# Patient Record
Sex: Male | Born: 1935 | Race: White | Hispanic: No | Smoking: Never smoker
Health system: Southern US, Community
[De-identification: ages and names within clinical notes are randomized; demographics above are authoritative.]

---

## 2019-01-05 ENCOUNTER — Other Ambulatory Visit: Payer: Self-pay

## 2019-01-05 ENCOUNTER — Inpatient Hospital Stay (HOSPITAL_COMMUNITY)
Admission: EM | Admit: 2019-01-05 | Discharge: 2019-01-11 | DRG: 871 | Disposition: A | Discharge status: Skilled Nursing Facility | Payer: Medicare Other | Source: Skilled Nursing Facility | Attending: Internal Medicine | Admitting: Internal Medicine

## 2019-01-05 DIAGNOSIS — L89159 Pressure ulcer of sacral region, unspecified stage: Secondary | ICD-10-CM | POA: Diagnosis present

## 2019-01-05 DIAGNOSIS — S81809A Unspecified open wound, unspecified lower leg, initial encounter: Secondary | ICD-10-CM

## 2019-01-05 DIAGNOSIS — E860 Dehydration: Secondary | ICD-10-CM | POA: Diagnosis present

## 2019-01-05 DIAGNOSIS — A419 Sepsis, unspecified organism: Secondary | ICD-10-CM | POA: Diagnosis present

## 2019-01-05 DIAGNOSIS — I4891 Unspecified atrial fibrillation: Secondary | ICD-10-CM

## 2019-01-05 DIAGNOSIS — E039 Hypothyroidism, unspecified: Secondary | ICD-10-CM | POA: Diagnosis present

## 2019-01-05 DIAGNOSIS — Z79899 Other long term (current) drug therapy: Secondary | ICD-10-CM

## 2019-01-05 DIAGNOSIS — L8990 Pressure ulcer of unspecified site, unspecified stage: Secondary | ICD-10-CM | POA: Insufficient documentation

## 2019-01-05 DIAGNOSIS — Z7401 Bed confinement status: Secondary | ICD-10-CM

## 2019-01-05 DIAGNOSIS — Z66 Do not resuscitate: Secondary | ICD-10-CM | POA: Diagnosis present

## 2019-01-05 DIAGNOSIS — G9341 Metabolic encephalopathy: Secondary | ICD-10-CM | POA: Diagnosis present

## 2019-01-05 DIAGNOSIS — F0390 Unspecified dementia without behavioral disturbance: Secondary | ICD-10-CM | POA: Diagnosis present

## 2019-01-05 DIAGNOSIS — E785 Hyperlipidemia, unspecified: Secondary | ICD-10-CM | POA: Diagnosis present

## 2019-01-05 DIAGNOSIS — Z95810 Presence of automatic (implantable) cardiac defibrillator: Secondary | ICD-10-CM

## 2019-01-05 DIAGNOSIS — R6521 Severe sepsis with septic shock: Secondary | ICD-10-CM | POA: Diagnosis present

## 2019-01-05 DIAGNOSIS — I2510 Atherosclerotic heart disease of native coronary artery without angina pectoris: Secondary | ICD-10-CM | POA: Diagnosis present

## 2019-01-05 DIAGNOSIS — E46 Unspecified protein-calorie malnutrition: Secondary | ICD-10-CM | POA: Diagnosis present

## 2019-01-05 DIAGNOSIS — K219 Gastro-esophageal reflux disease without esophagitis: Secondary | ICD-10-CM | POA: Diagnosis present

## 2019-01-05 DIAGNOSIS — E871 Hypo-osmolality and hyponatremia: Secondary | ICD-10-CM | POA: Diagnosis present

## 2019-01-05 DIAGNOSIS — U071 COVID-19: Secondary | ICD-10-CM | POA: Diagnosis present

## 2019-01-05 DIAGNOSIS — A4189 Other specified sepsis: Principal | ICD-10-CM | POA: Diagnosis present

## 2019-01-05 DIAGNOSIS — Z6830 Body mass index (BMI) 30.0-30.9, adult: Secondary | ICD-10-CM

## 2019-01-05 DIAGNOSIS — J9811 Atelectasis: Secondary | ICD-10-CM | POA: Diagnosis present

## 2019-01-05 DIAGNOSIS — D649 Anemia, unspecified: Secondary | ICD-10-CM | POA: Diagnosis present

## 2019-01-05 DIAGNOSIS — I5032 Chronic diastolic (congestive) heart failure: Secondary | ICD-10-CM | POA: Diagnosis present

## 2019-01-05 DIAGNOSIS — R627 Adult failure to thrive: Secondary | ICD-10-CM | POA: Diagnosis present

## 2019-01-05 DIAGNOSIS — I482 Chronic atrial fibrillation, unspecified: Secondary | ICD-10-CM | POA: Diagnosis present

## 2019-01-05 DIAGNOSIS — Z7982 Long term (current) use of aspirin: Secondary | ICD-10-CM

## 2019-01-05 DIAGNOSIS — Z79890 Hormone replacement therapy (postmenopausal): Secondary | ICD-10-CM

## 2019-01-05 DIAGNOSIS — E872 Acidosis: Secondary | ICD-10-CM | POA: Diagnosis present

## 2019-01-05 DIAGNOSIS — L03114 Cellulitis of left upper limb: Secondary | ICD-10-CM | POA: Diagnosis not present

## 2019-01-05 DIAGNOSIS — Z931 Gastrostomy status: Secondary | ICD-10-CM

## 2019-01-05 DIAGNOSIS — Z86718 Personal history of other venous thrombosis and embolism: Secondary | ICD-10-CM

## 2019-01-05 DIAGNOSIS — R7881 Bacteremia: Secondary | ICD-10-CM

## 2019-01-05 DIAGNOSIS — L0390 Cellulitis, unspecified: Secondary | ICD-10-CM | POA: Diagnosis present

## 2019-01-05 DIAGNOSIS — L03116 Cellulitis of left lower limb: Secondary | ICD-10-CM | POA: Diagnosis present

## 2019-01-05 DIAGNOSIS — E876 Hypokalemia: Secondary | ICD-10-CM | POA: Diagnosis present

## 2019-01-05 LAB — CBC WITH DIFFERENTIAL/PLATELET
Abs Immature Granulocytes: 0.19 10*3/uL — ABNORMAL HIGH (ref 0.00–0.07)
Basophils Absolute: 0 10*3/uL (ref 0.0–0.1)
Basophils Relative: 0 %
Eosinophils Absolute: 0 10*3/uL (ref 0.0–0.5)
Eosinophils Relative: 0 %
HCT: 34.9 % — ABNORMAL LOW (ref 39.0–52.0)
Hemoglobin: 11.1 g/dL — ABNORMAL LOW (ref 13.0–17.0)
Immature Granulocytes: 1 %
Lymphocytes Relative: 6 %
Lymphs Abs: 0.9 10*3/uL (ref 0.7–4.0)
MCH: 30.5 pg (ref 26.0–34.0)
MCHC: 31.8 g/dL (ref 30.0–36.0)
MCV: 95.9 fL (ref 80.0–100.0)
Monocytes Absolute: 0.8 10*3/uL (ref 0.1–1.0)
Monocytes Relative: 5 %
Neutro Abs: 12.7 10*3/uL — ABNORMAL HIGH (ref 1.7–7.7)
Neutrophils Relative %: 88 %
Platelets: 265 10*3/uL (ref 150–400)
RBC: 3.64 MIL/uL — ABNORMAL LOW (ref 4.22–5.81)
RDW: 16.4 % — ABNORMAL HIGH (ref 11.5–15.5)
WBC: 14.5 10*3/uL — ABNORMAL HIGH (ref 4.0–10.5)
nRBC: 0 % (ref 0.0–0.2)

## 2019-01-05 NOTE — ED Triage Notes (Signed)
Pt is coming from [place] with c/o wounds bleeding. Patients caregiver reports minimal bleeding of wounds on bilateral legs. Pedal pulses were found. Pt denies any SOB and wears 2L of oxygen.  VITALS:  BP 101/41 HR 90 RR 18 SPO2 98% 2L

## 2019-01-05 NOTE — ED Notes (Signed)
Attempted to collect two sets of blood cultures unsuccessfully. One set has been collected. Will continue to monitor.

## 2019-01-05 NOTE — ED Provider Notes (Signed)
[facility]-EMERGENCY DEPT Provider Note   CSN: [id number] Arrival date & time: 01/05/19  2130     History   Chief Complaint Chief Complaint  Patient presents with  . Covid +  . Wounds    HPI Logan Blackwell is a 85 y.o. male with a h/o of Atrial Fibrillation, cognitive communication deficit, hypothyroidism, hyperlipidemia, hyponatremia, CAD, chronic atrial fibrillation, chronic diastolic heart failure, and history of VTE, and pacemaker obtained from the patient's admission record for [facility].  Spoke with Inge Rise, LPN at [facility].  Reports that the patient tested positive for COVID-19 yesterday.  He was at a facility called [facility] in [place], but was transferred to [facility] and [place] because they had a Covid unit.  He arrived on the floor tonight at approximately 18:30.  Nursing staff was completing an initial assessment and became concerned after reviewing 2 large wounds on his lower legs so he was sent to the emergency department for further evaluation.  Per [facility] admission review, the patient has dementia and medical conditions listed above. He is currently on doxycycline for left elbow cellulitis on 11/22.   Patient is oriented to his name and year.  He believes that he is in [facility].  He was unaware that he had been transferred to [facility].  Per the patient, he thinks that he tested positive for COVID-19 a month ago.  He believes himself to be at the ER because his nurse wrapped the wounds on his legs too tightly.  He states " they keep saying that I fell and that is what caused these wounds, but it is just because of how tightly the dressing was placed."  He is a full code.  No family at bedside.  Addendum: 02:58-department Diplomatic Services operational officer was able to obtain records from [facility]. PEG tube in secondary to dysphagia.  He had a previous hospitalization from 7/25-8/10 after he presented for a fall.  He was found to  have septic shock secondary to UTI.  His CODE STATUS was noted to be attempt CPR during this admission.  He was noted to have had metabolic encephalopathy.  He has an AICD but is not on anticoagulation after what sounds like hemarthrosis of the knee in 2019.  Past medical history includes hypertension, dyslipidemia, A. fib, history of DVT, coagulopathy, hypothyroidism, thrombocytopenia, hypoalbuminemia, hypokalemia, CAD, anemia, rhabdomyolysis, septic shock, cellulitis, CHF, and AKI.  Level V caveat secondary to dementia.     The history is provided by the patient and the nursing home. The history is limited by the condition of the patient and the absence of a caregiver. No language interpreter was used.    No past medical history on file.  There are no active problems to display for this patient.    Home Medications    Prior to Admission medications   Medication Sig Start Date End Date Taking? Authorizing Provider  acetaminophen (TYLENOL) 325 MG tablet Take 650 mg by mouth every 8 (eight) hours as needed for mild pain, moderate pain, fever or headache.   Yes [provider]  Amino Acids-Protein Hydrolys (FEEDING SUPPLEMENT, PRO-STAT SUGAR FREE 64,) LIQD Take 30 mLs by mouth 3 (three) times daily with meals.   Yes [provider]  aspirin 81 MG chewable tablet Chew 81 mg by mouth daily.   Yes [provider]  atorvastatin (LIPITOR) 40 MG tablet Take 40 mg by mouth at bedtime.   Yes [provider]  emollient (BIAFINE)  cream Apply 1 application topically daily.   Yes [provider]  Ferrous Sulfate 220 (44 Fe) MG/5ML LIQD Take 5 mLs by mouth daily.   Yes [provider]  folic acid (FOLVITE) 1 MG tablet Take 1 mg by mouth daily.   Yes [provider]  furosemide (LASIX) 40 MG tablet Take 40 mg by mouth daily.   Yes [provider]  ketoconazole (NIZORAL) 2 % cream Apply 1 application topically 2 (two) times daily.    Yes [provider]  levothyroxine (SYNTHROID) 175 MCG tablet Take 175 mcg by mouth daily before breakfast.   Yes [provider]  Melatonin 3 MG CAPS Take 3 mg by mouth at bedtime.   Yes [provider]  metoprolol tartrate (LOPRESSOR) 25 MG tablet Take 25 mg by mouth 2 (two) times daily.   Yes [provider]  Multiple Vitamin (MULTIVITAMIN) LIQD Take 5 mLs by mouth daily.   Yes [provider]  pantoprazole sodium (PROTONIX) 40 mg/20 mL PACK Take 40 mg by mouth daily.   Yes [provider]  potassium chloride 20 MEQ/15ML (10%) SOLN Take 20 mEq by mouth daily.   Yes [provider]  sertraline (ZOLOFT) 50 MG tablet Take 50 mg by mouth daily.   Yes [provider]  vitamin B-12 (CYANOCOBALAMIN) 1000 MCG tablet Take 1,000 mcg by mouth daily.   Yes [provider]    Family History No family history on file.  Social History Social History   Tobacco Use  . Smoking status: Not on file  Substance Use Topics  . Alcohol use: Not on file  . Drug use: Not on file     Allergies   Patient has no known allergies.   Review of Systems Review of Systems  Unable to perform ROS: Dementia     Physical Exam Updated Vital Signs BP (!) 101/56 (BP Location: Right Arm)   Pulse 82   Temp 98.9 F (37.2 C) (Rectal)   Resp (!) 22   SpO2 98%   Physical Exam Vitals signs and nursing note reviewed.  Constitutional:      Appearance: He is well-developed.     Comments: Nasal cannula in place on 2.5 L  HENT:     Head: Normocephalic.  Eyes:     Conjunctiva/sclera: Conjunctivae normal.  Neck:     Musculoskeletal: Neck supple.  Cardiovascular:     Rate and Rhythm: Normal rate and regular rhythm.     Pulses: Normal pulses.     Heart sounds: Normal heart sounds. No murmur. No friction rub. No gallop.   Pulmonary:     Effort: Pulmonary effort is normal.     Comments: No retractions, accessory muscle use or  increased work of breathing. Abdominal:     General: There is no distension.     Palpations: Abdomen is soft. There is no mass.     Tenderness: There is no abdominal tenderness. There is no right CVA tenderness, left CVA tenderness, guarding or rebound.     Hernia: No hernia is present.     Comments: PEG tube in place.  No tenderness, redness, or swelling around the site.  Musculoskeletal:     Comments: Most of the entire left upper extremity is red and hot to the touch.  There is swelling noted to the forearm.  He has pain with range of motion of the left elbow.  He is also tender to palpation diffusely to the left forearm and upper  arm.  Radial pulses are 2+ bilaterally.  He is able to move his digits of the bilateral hands.  No significant tenderness to palpation to the left shoulder.  He has a skin tear noted to the left upper back.  There is a sacral decubitus ulcer, unable to be staged at this time.  Skin:    General: Skin is warm and dry.  Neurological:     Mental Status: He is alert.  Psychiatric:        Behavior: Behavior normal.   Left lower extremity-dressing dated 11/24 was in place.  There appeared to be stool on the outside of the dressing.  There was an ABD pad under gauze that was saturated with blood.  There is exposed muscle.  No obvious bone exposure.       Right lower extremity-chronic appearing venous stasis ulcer noted to the right lower leg.   DP pulses are palpable with Doppler.  ED Treatments / Results  Labs (all labs ordered are listed, but only abnormal results are displayed) Labs Reviewed  LACTIC ACID, PLASMA - Abnormal; Notable for the following components:      Result Value   Lactic Acid, Venous 2.3 (*)    All other components within normal limits  LACTIC ACID, PLASMA - Abnormal; Notable for the following components:   Lactic Acid, Venous 2.0 (*)    All other components within normal limits  COMPREHENSIVE METABOLIC PANEL - Abnormal; Notable for  the following components:   Sodium 134 (*)    Glucose, Bld 109 (*)    BUN 46 (*)    Calcium 7.9 (*)    Total Protein 6.2 (*)    Albumin 2.3 (*)    AST 43 (*)    All other components within normal limits  CBC WITH DIFFERENTIAL/PLATELET - Abnormal; Notable for the following components:   WBC 14.5 (*)    RBC 3.64 (*)    Hemoglobin 11.1 (*)    HCT 34.9 (*)    RDW 16.4 (*)    Neutro Abs 12.7 (*)    Abs Immature Granulocytes 0.19 (*)    All other components within normal limits  URINALYSIS, ROUTINE W REFLEX MICROSCOPIC - Abnormal; Notable for the following components:   APPearance HAZY (*)    Hgb urine dipstick MODERATE (*)    Bacteria, UA RARE (*)    All other components within normal limits  CULTURE, BLOOD (ROUTINE X 2)  CULTURE, BLOOD (ROUTINE X 2)  URINE CULTURE  APTT  PROTIME-INR  POC SARS CORONAVIRUS 2 AG -  ED    EKG None  Radiology Dg Tibia/fibula Left  Result Date: 01/06/2019 CLINICAL DATA:  Bleeding wounds EXAM: LEFT TIBIA AND FIBULA - 2 VIEW COMPARISON:  None. FINDINGS: Diffuse vascular calcifications. No acute bony abnormality. Specifically, no fracture, subluxation, or dislocation. IMPRESSION: No acute bony abnormality. Electronically Signed   By: Rolm Baptise M.D.   On: 01/06/2019 01:17   Dg Tibia/fibula Right  Result Date: 01/06/2019 CLINICAL DATA:  Bleeding wounds EXAM: RIGHT TIBIA AND FIBULA - 2 VIEW COMPARISON:  None. FINDINGS: Diffuse vascular calcifications. No acute bony abnormality. Specifically, no fracture, subluxation, or dislocation. IMPRESSION: No acute bony abnormality. Electronically Signed   By: Rolm Baptise M.D.   On: 01/06/2019 01:16   Dg Chest Port 1 View  Result Date: 01/06/2019 CLINICAL DATA:  COVID positive EXAM: PORTABLE CHEST 1 VIEW COMPARISON:  None. FINDINGS: Left pacer in place with leads in the right atrium and right ventricle. Aortic  calcifications. Low lung volumes. Right infrahilar atelectasis or infiltrate. Left base atelectasis  and possible small left effusion versus pleural thickening. Left hilar or infrahilar calcification compatible with old granulomatous disease. IMPRESSION: Bibasilar atelectasis or infiltrates. Possible small left effusion versus pleural thickening. Electronically Signed   By: Charlett Nose M.D.   On: 01/06/2019 01:18    Procedures .Critical Care Performed by: Barkley Boards, PA-C Authorized by: Barkley Boards, PA-C   Critical care provider statement:    Critical care time (minutes):  55   Critical care time was exclusive of:  Separately billable procedures and treating other patients and teaching time   Critical care was necessary to treat or prevent imminent or life-threatening deterioration of the following conditions:  Sepsis   Critical care was time spent personally by me on the following activities:  Ordering and performing treatments and interventions, ordering and review of laboratory studies, ordering and review of radiographic studies, pulse oximetry, re-evaluation of patient's condition, review of old charts, obtaining history from patient or surrogate, examination of patient, evaluation of patient's response to treatment and development of treatment plan with patient or surrogate   (including critical care time)  Medications Ordered in ED Medications  vancomycin (VANCOCIN) 1,250 mg in sodium chloride 0.9 % 250 mL IVPB (1,250 mg Intravenous New Bag/Given 01/06/19 0157)  sodium chloride 0.9 % bolus 500 mL (0 mLs Intravenous Stopped 01/06/19 0152)  ceFEPIme (MAXIPIME) 2 g in sodium chloride 0.9 % 100 mL IVPB (0 g Intravenous Stopped 01/06/19 0152)  fentaNYL (SUBLIMAZE) injection 50 mcg (50 mcg Intravenous Given 01/06/19 0307)     Initial Impression / Assessment and Plan / ED Course  I have reviewed the triage vital signs and the nursing notes.  Pertinent labs & imaging results that were available during my care of the patient were reviewed by me and considered in my medical  decision making (see chart for details).        85 year old male with a h/o of Atrial Fibrillation, cognitive communication deficit, hypothyroidism, hyperlipidemia, hyponatremia, CAD, chronic atrial fibrillation, chronic diastolic heart failure, and history of VTE, and pacemaker presenting by EMS from [facility] facility.   The patient appears to have a complex medical history, but the patient although he is oriented to name and year, seems confused.  Friend says, he told me that he believes that he tested positive for COVID-19 over a month ago; however, staff reports that his positive Covid test was yesterday.  Attempting to obtain medical records from [facility] and [facility] at this time.  Patient is not hypoxic and not on 2.5 L nasal cannula.  Blood pressures have been soft, but he has not been hypotensive.  Rectal temp is 98.9.  However, lactate is elevated and improving on repeat with 500 cc fluid bolus.  Fluids given cautiously given his history of heart failure and known COVID-19 diagnosis.  He also has a leukocytosis of 14.  Code sepsis initiated and he was started on broad-spectrum antibiotics with cefepime and vancomycin.  Source is thought to be the wounds on his lower legs as well as his left upper extremity.  No evidence of osteomyelitis on bilateral lower extremity x-rays.  he does have a history of DVT and is not on anticoagulation.  If CT is unremarkable, he may need a venous duplex of his left upper extremity.  Chest x-ray with bibasilar atelectasis or infiltrates, concerning for small left effusion versus pleural thickening.  Suspect this is secondary  to COVID-19.  Per admission record at [facility], the patient has been treated for cellulitis of his left elbow for the last week.  On exam, his entire left upper extremity is red and hot to the touch.  CT of the left upper extremity has been ordered and is pending.  Consult to the hospitalist team and Dr.  Toniann Fail will admit. The patient appears reasonably stabilized for admission considering the current resources, flow, and capabilities available in the ED at this time, and I doubt any other [facility] requiring further screening and/or treatment in the ED prior to admission.   Final Clinical Impressions(s) / ED Diagnoses   Final diagnoses:  Open leg wound  Sepsis without acute organ dysfunction, due to unspecified organism [facility])  Cellulitis of left arm    ED Discharge Orders    None       Barkley Boards, PA-C 01/06/19 0322    Palumbo, April, MD 01/06/19 [id number]

## 2019-01-06 ENCOUNTER — Encounter (HOSPITAL_COMMUNITY): Payer: Medicare Other

## 2019-01-06 ENCOUNTER — Emergency Department (HOSPITAL_COMMUNITY): Payer: Medicare Other

## 2019-01-06 ENCOUNTER — Encounter (HOSPITAL_COMMUNITY): Payer: Self-pay

## 2019-01-06 ENCOUNTER — Inpatient Hospital Stay (HOSPITAL_COMMUNITY): Payer: Medicare Other

## 2019-01-06 DIAGNOSIS — A4189 Other specified sepsis: Secondary | ICD-10-CM | POA: Diagnosis present

## 2019-01-06 DIAGNOSIS — R7881 Bacteremia: Secondary | ICD-10-CM | POA: Diagnosis not present

## 2019-01-06 DIAGNOSIS — S81809A Unspecified open wound, unspecified lower leg, initial encounter: Secondary | ICD-10-CM | POA: Insufficient documentation

## 2019-01-06 DIAGNOSIS — L989 Disorder of the skin and subcutaneous tissue, unspecified: Secondary | ICD-10-CM | POA: Diagnosis not present

## 2019-01-06 DIAGNOSIS — I340 Nonrheumatic mitral (valve) insufficiency: Secondary | ICD-10-CM | POA: Diagnosis not present

## 2019-01-06 DIAGNOSIS — E871 Hypo-osmolality and hyponatremia: Secondary | ICD-10-CM | POA: Diagnosis present

## 2019-01-06 DIAGNOSIS — E46 Unspecified protein-calorie malnutrition: Secondary | ICD-10-CM | POA: Diagnosis present

## 2019-01-06 DIAGNOSIS — I2510 Atherosclerotic heart disease of native coronary artery without angina pectoris: Secondary | ICD-10-CM | POA: Diagnosis present

## 2019-01-06 DIAGNOSIS — Z95810 Presence of automatic (implantable) cardiac defibrillator: Secondary | ICD-10-CM | POA: Diagnosis not present

## 2019-01-06 DIAGNOSIS — D649 Anemia, unspecified: Secondary | ICD-10-CM | POA: Diagnosis present

## 2019-01-06 DIAGNOSIS — L89159 Pressure ulcer of sacral region, unspecified stage: Secondary | ICD-10-CM | POA: Diagnosis present

## 2019-01-06 DIAGNOSIS — Z86718 Personal history of other venous thrombosis and embolism: Secondary | ICD-10-CM | POA: Diagnosis not present

## 2019-01-06 DIAGNOSIS — L03114 Cellulitis of left upper limb: Secondary | ICD-10-CM | POA: Diagnosis present

## 2019-01-06 DIAGNOSIS — Z79890 Hormone replacement therapy (postmenopausal): Secondary | ICD-10-CM | POA: Diagnosis not present

## 2019-01-06 DIAGNOSIS — L0390 Cellulitis, unspecified: Secondary | ICD-10-CM | POA: Diagnosis present

## 2019-01-06 DIAGNOSIS — Z7982 Long term (current) use of aspirin: Secondary | ICD-10-CM | POA: Diagnosis not present

## 2019-01-06 DIAGNOSIS — L03116 Cellulitis of left lower limb: Secondary | ICD-10-CM | POA: Diagnosis present

## 2019-01-06 DIAGNOSIS — A419 Sepsis, unspecified organism: Secondary | ICD-10-CM | POA: Diagnosis not present

## 2019-01-06 DIAGNOSIS — U071 COVID-19: Secondary | ICD-10-CM | POA: Diagnosis present

## 2019-01-06 DIAGNOSIS — G9341 Metabolic encephalopathy: Secondary | ICD-10-CM | POA: Diagnosis present

## 2019-01-06 DIAGNOSIS — Z931 Gastrostomy status: Secondary | ICD-10-CM | POA: Diagnosis not present

## 2019-01-06 DIAGNOSIS — I361 Nonrheumatic tricuspid (valve) insufficiency: Secondary | ICD-10-CM | POA: Diagnosis not present

## 2019-01-06 DIAGNOSIS — I5032 Chronic diastolic (congestive) heart failure: Secondary | ICD-10-CM | POA: Diagnosis present

## 2019-01-06 DIAGNOSIS — B9689 Other specified bacterial agents as the cause of diseases classified elsewhere: Secondary | ICD-10-CM | POA: Diagnosis not present

## 2019-01-06 DIAGNOSIS — R6521 Severe sepsis with septic shock: Secondary | ICD-10-CM | POA: Diagnosis present

## 2019-01-06 DIAGNOSIS — L309 Dermatitis, unspecified: Secondary | ICD-10-CM | POA: Diagnosis not present

## 2019-01-06 DIAGNOSIS — I482 Chronic atrial fibrillation, unspecified: Secondary | ICD-10-CM | POA: Diagnosis present

## 2019-01-06 DIAGNOSIS — E039 Hypothyroidism, unspecified: Secondary | ICD-10-CM | POA: Diagnosis present

## 2019-01-06 DIAGNOSIS — J9811 Atelectasis: Secondary | ICD-10-CM | POA: Diagnosis present

## 2019-01-06 DIAGNOSIS — I4891 Unspecified atrial fibrillation: Secondary | ICD-10-CM | POA: Diagnosis not present

## 2019-01-06 DIAGNOSIS — F0390 Unspecified dementia without behavioral disturbance: Secondary | ICD-10-CM | POA: Diagnosis present

## 2019-01-06 DIAGNOSIS — Z79899 Other long term (current) drug therapy: Secondary | ICD-10-CM | POA: Diagnosis not present

## 2019-01-06 DIAGNOSIS — R609 Edema, unspecified: Secondary | ICD-10-CM | POA: Diagnosis not present

## 2019-01-06 DIAGNOSIS — Z66 Do not resuscitate: Secondary | ICD-10-CM | POA: Diagnosis present

## 2019-01-06 DIAGNOSIS — E785 Hyperlipidemia, unspecified: Secondary | ICD-10-CM | POA: Diagnosis present

## 2019-01-06 LAB — CBC WITH DIFFERENTIAL/PLATELET
Abs Immature Granulocytes: 0.16 10*3/uL — ABNORMAL HIGH (ref 0.00–0.07)
Basophils Absolute: 0 10*3/uL (ref 0.0–0.1)
Basophils Relative: 0 %
Eosinophils Absolute: 0 10*3/uL (ref 0.0–0.5)
Eosinophils Relative: 0 %
HCT: 32.4 % — ABNORMAL LOW (ref 39.0–52.0)
Hemoglobin: 10.4 g/dL — ABNORMAL LOW (ref 13.0–17.0)
Immature Granulocytes: 1 %
Lymphocytes Relative: 7 %
Lymphs Abs: 1 10*3/uL (ref 0.7–4.0)
MCH: 30.8 pg (ref 26.0–34.0)
MCHC: 32.1 g/dL (ref 30.0–36.0)
MCV: 95.9 fL (ref 80.0–100.0)
Monocytes Absolute: 0.7 10*3/uL (ref 0.1–1.0)
Monocytes Relative: 5 %
Neutro Abs: 11.3 10*3/uL — ABNORMAL HIGH (ref 1.7–7.7)
Neutrophils Relative %: 87 %
Platelets: 227 10*3/uL (ref 150–400)
RBC: 3.38 MIL/uL — ABNORMAL LOW (ref 4.22–5.81)
RDW: 16.2 % — ABNORMAL HIGH (ref 11.5–15.5)
WBC: 13.1 10*3/uL — ABNORMAL HIGH (ref 4.0–10.5)
nRBC: 0 % (ref 0.0–0.2)

## 2019-01-06 LAB — URINALYSIS, ROUTINE W REFLEX MICROSCOPIC
Bilirubin Urine: NEGATIVE
Glucose, UA: NEGATIVE mg/dL
Ketones, ur: NEGATIVE mg/dL
Leukocytes,Ua: NEGATIVE
Nitrite: NEGATIVE
Protein, ur: NEGATIVE mg/dL
Specific Gravity, Urine: 1.015 (ref 1.005–1.030)
pH: 5 (ref 5.0–8.0)

## 2019-01-06 LAB — COMPREHENSIVE METABOLIC PANEL
ALT: 23 U/L (ref 0–44)
ALT: 24 U/L (ref 0–44)
AST: 42 U/L — ABNORMAL HIGH (ref 15–41)
AST: 43 U/L — ABNORMAL HIGH (ref 15–41)
Albumin: 2.1 g/dL — ABNORMAL LOW (ref 3.5–5.0)
Albumin: 2.3 g/dL — ABNORMAL LOW (ref 3.5–5.0)
Alkaline Phosphatase: 45 U/L (ref 38–126)
Alkaline Phosphatase: 49 U/L (ref 38–126)
Anion gap: 12 (ref 5–15)
Anion gap: 9 (ref 5–15)
BUN: 46 mg/dL — ABNORMAL HIGH (ref 8–23)
BUN: 47 mg/dL — ABNORMAL HIGH (ref 8–23)
CO2: 24 mmol/L (ref 22–32)
CO2: 25 mmol/L (ref 22–32)
Calcium: 7.9 mg/dL — ABNORMAL LOW (ref 8.9–10.3)
Calcium: 7.9 mg/dL — ABNORMAL LOW (ref 8.9–10.3)
Chloride: 100 mmol/L (ref 98–111)
Chloride: 98 mmol/L (ref 98–111)
Creatinine, Ser: 0.87 mg/dL (ref 0.61–1.24)
Creatinine, Ser: 0.95 mg/dL (ref 0.61–1.24)
GFR calc Af Amer: >60 mL/min (ref >60)
GFR calc Af Amer: >60 mL/min (ref >60)
GFR calc non Af Amer: >60 mL/min (ref >60)
GFR calc non Af Amer: >60 mL/min (ref >60)
Glucose, Bld: 105 mg/dL — ABNORMAL HIGH (ref 70–99)
Glucose, Bld: 109 mg/dL — ABNORMAL HIGH (ref 70–99)
Potassium: 3.6 mmol/L (ref 3.5–5.1)
Potassium: 3.9 mmol/L (ref 3.5–5.1)
Sodium: 134 mmol/L — ABNORMAL LOW (ref 135–145)
Sodium: 134 mmol/L — ABNORMAL LOW (ref 135–145)
Total Bilirubin: 0.8 mg/dL (ref 0.3–1.2)
Total Bilirubin: 1 mg/dL (ref 0.3–1.2)
Total Protein: 5.8 g/dL — ABNORMAL LOW (ref 6.5–8.1)
Total Protein: 6.2 g/dL — ABNORMAL LOW (ref 6.5–8.1)

## 2019-01-06 LAB — BLOOD CULTURE ID PANEL (REFLEXED)
Acinetobacter baumannii: DETECTED — AB
Candida albicans: NOT DETECTED
Candida glabrata: NOT DETECTED
Candida krusei: NOT DETECTED
Candida parapsilosis: NOT DETECTED
Candida tropicalis: NOT DETECTED
Carbapenem resistance: NOT DETECTED
Enterobacter cloacae complex: NOT DETECTED
Enterobacteriaceae species: NOT DETECTED
Enterococcus species: NOT DETECTED
Escherichia coli: NOT DETECTED
Haemophilus influenzae: NOT DETECTED
Klebsiella oxytoca: NOT DETECTED
Klebsiella pneumoniae: NOT DETECTED
Listeria monocytogenes: NOT DETECTED
Neisseria meningitidis: NOT DETECTED
Proteus species: NOT DETECTED
Pseudomonas aeruginosa: NOT DETECTED
Serratia marcescens: NOT DETECTED
Staphylococcus aureus (BCID): NOT DETECTED
Staphylococcus species: NOT DETECTED
Streptococcus agalactiae: NOT DETECTED
Streptococcus pneumoniae: NOT DETECTED
Streptococcus pyogenes: NOT DETECTED
Streptococcus species: NOT DETECTED

## 2019-01-06 LAB — PROTIME-INR
INR: 1.1 (ref 0.8–1.2)
Prothrombin Time: 14.5 seconds (ref 11.4–15.2)

## 2019-01-06 LAB — GLUCOSE, CAPILLARY
Glucose-Capillary: 139 mg/dL — ABNORMAL HIGH (ref 70–99)
Glucose-Capillary: 137 mg/dL — ABNORMAL HIGH (ref 70–99)

## 2019-01-06 LAB — LACTIC ACID, PLASMA
Lactic Acid, Venous: 1.6 mmol/L (ref 0.5–1.9)
Lactic Acid, Venous: 1.8 mmol/L (ref 0.5–1.9)
Lactic Acid, Venous: 2 mmol/L (ref 0.5–1.9)
Lactic Acid, Venous: 2.3 mmol/L (ref 0.5–1.9)

## 2019-01-06 LAB — URINE CULTURE: Culture: NO GROWTH

## 2019-01-06 LAB — ECHOCARDIOGRAM COMPLETE
Height: 67 in
Weight: 3072.33 oz

## 2019-01-06 LAB — POC SARS CORONAVIRUS 2 AG -  ED: SARS Coronavirus 2 Ag: POSITIVE — AB

## 2019-01-06 LAB — MRSA PCR SCREENING: MRSA by PCR: NEGATIVE

## 2019-01-06 LAB — APTT: aPTT: 34 seconds (ref 24–36)

## 2019-01-06 LAB — PROCALCITONIN: Procalcitonin: 3.27 ng/mL

## 2019-01-06 MED ORDER — FREE WATER
100.0000 mL | Freq: Four times a day (QID) | Status: DC
  Administered 2019-01-06 – 2019-01-11 (×18): 100 mL

## 2019-01-06 MED ORDER — SODIUM CHLORIDE 0.9 % IV SOLN
2.0000 g | Freq: Three times a day (TID) | INTRAVENOUS | Status: DC
  Administered 2019-01-06 (×2): 2 g via INTRAVENOUS
  Filled 2019-01-06 (×2): qty 2

## 2019-01-06 MED ORDER — VANCOMYCIN HCL 10 G IV SOLR
1250.0000 mg | Freq: Once | INTRAVENOUS | Status: AC
  Administered 2019-01-06: 1250 mg via INTRAVENOUS
  Filled 2019-01-06: qty 1250

## 2019-01-06 MED ORDER — SODIUM CHLORIDE 0.9 % IV SOLN
INTRAVENOUS | Status: AC
  Administered 2019-01-06 (×3): via INTRAVENOUS

## 2019-01-06 MED ORDER — METOPROLOL TARTRATE 25 MG PO TABS
25.0000 mg | ORAL_TABLET | Freq: Two times a day (BID) | ORAL | Status: DC
  Administered 2019-01-07 – 2019-01-11 (×8): 25 mg via ORAL
  Filled 2019-01-06 (×8): qty 1

## 2019-01-06 MED ORDER — VITAL HIGH PROTEIN PO LIQD: 1000.0000 mL | ORAL | Status: DC

## 2019-01-06 MED ORDER — SERTRALINE HCL 50 MG PO TABS
50.0000 mg | ORAL_TABLET | Freq: Every day | ORAL | Status: DC
  Administered 2019-01-07 – 2019-01-11 (×5): 50 mg via ORAL
  Filled 2019-01-06 (×5): qty 1

## 2019-01-06 MED ORDER — LEVOTHYROXINE SODIUM 50 MCG PO TABS
175.0000 ug | ORAL_TABLET | Freq: Every day | ORAL | Status: DC
  Administered 2019-01-08 – 2019-01-11 (×4): 175 ug via ORAL
  Filled 2019-01-06 (×9): qty 1

## 2019-01-06 MED ORDER — IOHEXOL 300 MG/ML  SOLN
100.0000 mL | Freq: Once | INTRAMUSCULAR | Status: AC | PRN
  Administered 2019-01-06: 100 mL via INTRAVENOUS

## 2019-01-06 MED ORDER — ACETAMINOPHEN 325 MG PO TABS
650.0000 mg | ORAL_TABLET | Freq: Four times a day (QID) | ORAL | Status: DC | PRN
  Administered 2019-01-07: 650 mg via ORAL
  Filled 2019-01-06: qty 2

## 2019-01-06 MED ORDER — OSMOLITE 1.5 CAL PO LIQD
1000.0000 mL | ORAL | Status: DC
  Administered 2019-01-06 – 2019-01-10 (×4): 1000 mL
  Filled 2019-01-06 (×6): qty 1000

## 2019-01-06 MED ORDER — GERHARDT'S BUTT CREAM
TOPICAL_CREAM | Freq: Three times a day (TID) | CUTANEOUS | Status: AC
  Administered 2019-01-06 – 2019-01-09 (×9): via TOPICAL
  Administered 2019-01-09: 1 via TOPICAL
  Administered 2019-01-09 – 2019-01-10 (×4): via TOPICAL
  Filled 2019-01-06: qty 1

## 2019-01-06 MED ORDER — HEPARIN SODIUM (PORCINE) 5000 UNIT/ML IJ SOLN
5000.0000 [IU] | Freq: Three times a day (TID) | INTRAMUSCULAR | Status: DC
  Administered 2019-01-06 – 2019-01-11 (×15): 5000 [IU] via SUBCUTANEOUS
  Filled 2019-01-06 (×15): qty 1

## 2019-01-06 MED ORDER — VANCOMYCIN HCL 10 G IV SOLR
1500.0000 mg | INTRAVENOUS | Status: DC
  Administered 2019-01-06 – 2019-01-07 (×2): 1500 mg via INTRAVENOUS
  Filled 2019-01-06 (×3): qty 1500

## 2019-01-06 MED ORDER — VITAMIN B-12 1000 MCG PO TABS
1000.0000 ug | ORAL_TABLET | Freq: Every day | ORAL | Status: DC
  Administered 2019-01-07 – 2019-01-11 (×5): 1000 ug via ORAL
  Filled 2019-01-06 (×5): qty 1

## 2019-01-06 MED ORDER — FERROUS SULFATE 300 (60 FE) MG/5ML PO SYRP
300.0000 mg | ORAL_SOLUTION | Freq: Every day | ORAL | Status: DC
  Administered 2019-01-07 – 2019-01-11 (×5): 300 mg via ORAL
  Filled 2019-01-06 (×6): qty 5

## 2019-01-06 MED ORDER — ADULT MULTIVITAMIN LIQUID CH
15.0000 mL | Freq: Every day | ORAL | Status: DC
  Administered 2019-01-06 – 2019-01-11 (×6): 15 mL via ORAL
  Filled 2019-01-06 (×6): qty 15

## 2019-01-06 MED ORDER — PANTOPRAZOLE SODIUM 40 MG PO PACK
40.0000 mg | PACK | Freq: Every day | ORAL | Status: DC
  Administered 2019-01-07 – 2019-01-11 (×5): 40 mg via ORAL
  Filled 2019-01-06 (×5): qty 20

## 2019-01-06 MED ORDER — FENTANYL CITRATE (PF) 100 MCG/2ML IJ SOLN
50.0000 ug | Freq: Once | INTRAMUSCULAR | Status: AC
  Administered 2019-01-06: 03:00:00 50 ug via INTRAVENOUS
  Filled 2019-01-06: qty 2

## 2019-01-06 MED ORDER — PRO-STAT SUGAR FREE PO LIQD: 30.0000 mL | Freq: Three times a day (TID) | ORAL | Status: DC

## 2019-01-06 MED ORDER — SODIUM CHLORIDE 0.9 % IV BOLUS
500.0000 mL | Freq: Once | INTRAVENOUS | Status: AC
  Administered 2019-01-06: 500 mL via INTRAVENOUS

## 2019-01-06 MED ORDER — ACETAMINOPHEN 650 MG RE SUPP: 650.0000 mg | Freq: Four times a day (QID) | RECTAL | Status: DC | PRN

## 2019-01-06 MED ORDER — CHLORHEXIDINE GLUCONATE CLOTH 2 % EX PADS
6.0000 | MEDICATED_PAD | Freq: Every day | CUTANEOUS | Status: DC
  Administered 2019-01-06 – 2019-01-11 (×6): 6 via TOPICAL

## 2019-01-06 MED ORDER — SODIUM CHLORIDE 0.9 % IV SOLN
500.0000 mg | Freq: Four times a day (QID) | INTRAVENOUS | Status: DC
  Administered 2019-01-06 – 2019-01-08 (×8): 500 mg via INTRAVENOUS
  Filled 2019-01-06 (×9): qty 500

## 2019-01-06 MED ORDER — PRO-STAT SUGAR FREE PO LIQD
30.0000 mL | Freq: Three times a day (TID) | ORAL | Status: DC
  Administered 2019-01-06 – 2019-01-10 (×12): 30 mL
  Filled 2019-01-06 (×12): qty 30

## 2019-01-06 MED ORDER — ORAL CARE MOUTH RINSE
15.0000 mL | Freq: Two times a day (BID) | OROMUCOSAL | Status: DC
  Administered 2019-01-06 (×2): 15 mL via OROMUCOSAL

## 2019-01-06 MED ORDER — ONDANSETRON HCL 4 MG/2ML IJ SOLN: 4.0000 mg | Freq: Four times a day (QID) | INTRAMUSCULAR | Status: DC | PRN

## 2019-01-06 MED ORDER — ASPIRIN 81 MG PO CHEW
81.0000 mg | CHEWABLE_TABLET | Freq: Every day | ORAL | Status: DC
  Administered 2019-01-07 – 2019-01-11 (×5): 81 mg via ORAL
  Filled 2019-01-06 (×5): qty 1

## 2019-01-06 MED ORDER — SODIUM CHLORIDE (PF) 0.9 % IJ SOLN
INTRAMUSCULAR | Status: AC
  Filled 2019-01-06: qty 50

## 2019-01-06 MED ORDER — FOLIC ACID 1 MG PO TABS
1.0000 mg | ORAL_TABLET | Freq: Every day | ORAL | Status: DC
  Administered 2019-01-07 – 2019-01-11 (×5): 1 mg via ORAL
  Filled 2019-01-06 (×5): qty 1

## 2019-01-06 MED ORDER — ADULT MULTIVITAMIN LIQUID CH: 5.0000 mL | Freq: Every day | ORAL | Status: DC

## 2019-01-06 MED ORDER — ONDANSETRON HCL 4 MG PO TABS: 4.0000 mg | ORAL_TABLET | Freq: Four times a day (QID) | ORAL | Status: DC | PRN

## 2019-01-06 MED ORDER — ATORVASTATIN CALCIUM 40 MG PO TABS
40.0000 mg | ORAL_TABLET | Freq: Every day | ORAL | Status: DC
  Administered 2019-01-06 – 2019-01-10 (×5): 40 mg via ORAL
  Filled 2019-01-06 (×5): qty 1

## 2019-01-06 MED ORDER — SODIUM CHLORIDE 0.9 % IV SOLN
2.0000 g | INTRAVENOUS | Status: AC
  Administered 2019-01-06: 2 g via INTRAVENOUS
  Filled 2019-01-06: qty 2

## 2019-01-06 NOTE — Progress Notes (Signed)
Initial Nutrition Assessment  RD working remotely.   DOCUMENTATION CODES:   Not applicable  INTERVENTION:  - will order TF to meet 100% estimated needs as orally intake is unknown. - will order Osmolite 1.5 @ 50 ml/hr with 30 ml prostat TID.  - this regimen will provide 2100 kcal (96% estimated kcal need), 120 grams protein, and 914 ml free water. - free water flush, if desired, to be per MD.    NUTRITION DIAGNOSIS:   Increased nutrient needs related to wound healing, acute illness as evidenced by estimated needs.  GOAL:   Patient will meet greater than or equal to 90% of their needs  MONITOR:   PO intake, TF tolerance, Labs, Weight trends, Skin  REASON FOR ASSESSMENT:   Consult Enteral/tube feeding initiation and management, Assessment of nutrition requirement/status  ASSESSMENT:   85 y.o. male with history of AICD placement, A. fib, DVT, hypothyroidism, hypoalbuminemia, and CAD. He was admitted in July for septic shock. He was found to be COVID-19 positive PTA. He resides at [facility] and was found to have blood-tinged discharge from BLE wounds. He is unable to provide any history. He has a PEG.  Flow sheet indicates patient is a/o to person and place. Patient is COVID-19 positive. Able to talk with RN. Patient is currently on Heart Healthy diet. RN reports that patient has very poor dentition--he has very few teeth and the teeth he does have appear loose. Patient had told RN that he eats and drinks at home but it is unknown how much he truly consumes orally. Patient has PEG and RN reports that it is in place and patent.   Per chart review, current weight is 192 lb and no PTA weight information is available.   Per notes: - sepsis - COVID-19 - full code   Labs reviewed; Na: 134 mmol/l, BUN: 47 mg/dl, Ca: 7.9 mg/dl. Medications reviewed; 300 mg ferrous sulfate/day, 1 mg folvite/day, 175 mcg oral synthroid/day, 40 mg oral protonix/day, 1000 mcg oral cyanocobalamin/day.   IVF; NS @ 100 ml/hr.    NUTRITION - FOCUSED PHYSICAL EXAM:  unable to complete for COVID-19 positive patient.   Diet Order:   Diet Order    None      EDUCATION NEEDS:   Not appropriate for education at this time  Skin:  Skin Assessment: Skin Integrity Issues: Skin Integrity Issues:: Stage II Stage II: sacrum  Last BM:  PTA  Height:   Ht Readings from Last 1 Encounters:  01/06/19 5\' 7"  (1.702 m)    Weight:   Wt Readings from Last 1 Encounters:  01/06/19 87.1 kg    Ideal Body Weight:  67.3 kg  BMI:  Body mass index is 30.07 kg/m.  Estimated Nutritional Needs:   Kcal:  5329-9242 kcal  Protein:  105-120 grams  Fluid:  >/= 2.2 L/day      Jarome Matin, MS, RD, LDN, [facility] Inpatient Clinical Dietitian Pager # [phone] After hours/weekend pager # [phone]

## 2019-01-06 NOTE — ED Notes (Signed)
Date and time results received: 01/06/19 12:24 AM   Test: Lactic Critical Value: 2.3  Name of Provider Notified: Randal Buba, MD  Orders Received? Or Actions Taken?:

## 2019-01-06 NOTE — Consult Note (Signed)
[place] Nurse Consult Note: Reason for Consult: multiple wounds Reviewed images of LE in the chart.  It is apparent patient has venous insufficiency with the discoloration (Hemosiderin staining of the bilateral LEs). He has documented distal pulses.  There is mention of pressure injury on admission H&P that is "unstagable at this time". He has documented left upper extremity edema but no wounds at this time on the LUE.  Wound type: Venous stasis L>R with bleeding and hemosiderin staining Moisture associated skin damage (MASD) bilateral buttocks; patient incontinent of stool and uring Multiple skin tears; large on the left scapula; mid back; left posterior and anterior arm Multiple lesions on patient head; unclear etiology. Patient not able to give history.  Would recommend dermatology consultation for differential dx as outpatient; not open or draining so no topical care needed at this time Detachment of left ear lobe; admitted with this condition Pressure Injury POA: Yes? Pending consultation Measurement: will request nursing staff to measure when topical care provided  Wound bed: LLE; oozing blood; dressings saturated; some darkening at wound border; ruddy wound bed but does not appear infected RLE; scattered small partial thickness ulcers Pink scattered partial thickness skin loss over bilateral buttocks Pink, moist skin tears noted x 2 on patient's back; skin appears fragile Drainage (amount, consistency, odor) see above for the LEs  Periwound: intact at LE wounds, venous stasis dermatitis with hemosiderin staining Dressing procedure/placement/frequency: Will add calcium alginate for hemostatic properties if bleeding continues to be an issue.  If not ok to use xeroform gauze and dry boot compression Silicone foam to the skin tears per the skin care order set Gerhardt's butt cream to the bilateral buttock; consider FC only if condition worsenes LALM in place while in the ICU, will need air  mattress for pressure redistribution  PEG tube for supplementation for nutritional needs. No topical care for head lesions; follow up as outpatient with dermatology for differential dx. Xeroform to the left ear lobe; if repair considered would need ENT consultation.    Could benefit from management in a wound care center for LE wounds at the time of DC.   Discussed POC with patient and bedside nurse.  Re consult if needed, will not follow at this time. Thanks  Anaid Haney [employer], RN,CWOCN, CNS, [place] [phone])

## 2019-01-06 NOTE — Progress Notes (Signed)
PHARMACY - PHYSICIAN COMMUNICATION CRITICAL VALUE ALERT - BLOOD CULTURE IDENTIFICATION (BCID)  Logan Blackwell is an 85 y.o. male who presented to [facility] on 01/05/2019 with a chief complaint of weeping wound of the lower extremity.   Assessment: Admitted with sepsis, source cellulitis of lower extremity and left upper extremity   Name of physician (or Provider) Contacted: Dr. Wynelle Cleveland  Current antibiotics: Vancomycin and Cefepime   Changes to prescribed antibiotics recommended:  D/C Cefepime and start Imipenem/Cilastatin 500mg  IV q6h. MD continuing Vancomycin for now and will discuss with ID in AM. Repeat blood cultures ordered per MD.   Results for orders placed or performed during the hospital encounter of 01/05/19  Blood Culture ID Panel (Reflexed) (Collected: 01/05/2019 11:34 PM)  Result Value Ref Range   Enterococcus species NOT DETECTED NOT DETECTED   Listeria monocytogenes NOT DETECTED NOT DETECTED   Staphylococcus species NOT DETECTED NOT DETECTED   Staphylococcus aureus (BCID) NOT DETECTED NOT DETECTED   Streptococcus species NOT DETECTED NOT DETECTED   Streptococcus agalactiae NOT DETECTED NOT DETECTED   Streptococcus pneumoniae NOT DETECTED NOT DETECTED   Streptococcus pyogenes NOT DETECTED NOT DETECTED   Acinetobacter baumannii DETECTED (A) NOT DETECTED   Enterobacteriaceae species NOT DETECTED NOT DETECTED   Enterobacter cloacae complex NOT DETECTED NOT DETECTED   Escherichia coli NOT DETECTED NOT DETECTED   Klebsiella oxytoca NOT DETECTED NOT DETECTED   Klebsiella pneumoniae NOT DETECTED NOT DETECTED   Proteus species NOT DETECTED NOT DETECTED   Serratia marcescens NOT DETECTED NOT DETECTED   Carbapenem resistance NOT DETECTED NOT DETECTED   Haemophilus influenzae NOT DETECTED NOT DETECTED   Neisseria meningitidis NOT DETECTED NOT DETECTED   Pseudomonas aeruginosa NOT DETECTED NOT DETECTED   Candida albicans NOT DETECTED NOT DETECTED   Candida glabrata NOT  DETECTED NOT DETECTED   Candida krusei NOT DETECTED NOT DETECTED   Candida parapsilosis NOT DETECTED NOT DETECTED   Candida tropicalis NOT DETECTED NOT DETECTED    Logan Blackwell 01/06/2019  4:13 PM

## 2019-01-06 NOTE — Progress Notes (Signed)
  Echocardiogram 2D Echocardiogram has been performed.  Logan Blackwell M 01/06/2019, 11:03 AM

## 2019-01-06 NOTE — Progress Notes (Signed)
Paged MD to notify that family was requesting an update

## 2019-01-06 NOTE — Progress Notes (Signed)
[facility]  I have examined the patient and reviewed the chart. I agree with the plan outlined by the admitting doctor, Dr Hal Hope.  Principal Problem:   Sepsis  - tachycardic, leukocytosis, lactic acidosis, procalcitonin elevated  - BCID > Acinetobacter baumanii- Pharmacy recommends Imipenem - will repeat blood cultures and and follow- cont Vanc - d/c Cefepime - continue treatment for cellulitis and COVID 19- wean O2 as able  PEG tube- hyponatremia and dehydration - starting Osomolite  - adding free wather but continuing IVF as well as he appears dehydrated - holding Lasix- check ECHO to see EF  (no ECHO in our system)  A-fib - cont Lopressor as BP tolerates -not on anticoagulation at home   Hypothyroid - cont Synthroid   Anemia - check anemia panel  Debbe Odea, MD

## 2019-01-06 NOTE — Progress Notes (Signed)
A consult was received from an ED physician for cefepime and vancomycin per pharmacy dosing.  The patient's profile has been reviewed for ht/wt/allergies/indication/available labs.   A one time order has been placed for Cefepime 2 Gm and Vancomycin 1250 mg.  Further antibiotics/pharmacy consults should be ordered by admitting physician if indicated.                       Thank you, Dorrene German 01/06/2019  12:43 AM

## 2019-01-06 NOTE — Progress Notes (Signed)
Pharmacy Antibiotic Note  Logan Blackwell is a 85 y.o. male admitted on 01/05/2019 with bleeding bilateral LEs. Dementia. Doxy from 11/22 for L elbow cellulitis. Patient met sepsis criteria. Pharmacy has been consulted for Vancomycin & Cefepime dosing.  Plan: Cefepime 2gm q8h Vanc 1250 mg x1, then 1582m q24 for AUC 534, using SCr 1  Height: 6' (182.9 cm) Weight: 193 lb 5.5 oz (87.7 kg) IBW/kg (Calculated) : 77.6  Temp (24hrs), Avg:98.9 F (37.2 C), Min:98.9 F (37.2 C), Max:98.9 F (37.2 C)  Recent Labs  Lab 01/05/19 2334 01/06/19 0112 01/06/19 0648  WBC 14.5*  --  13.1*  CREATININE 0.95  --  0.87  LATICACIDVEN 2.3* 2.0* 1.6    Estimated Creatinine Clearance: 65.7 mL/min (by C-G formula based on SCr of 0.87 mg/dL).    No Known Allergies  Antimicrobials this admission: 11/29 Vanc >>  11/29 Cefepime >>   Dose adjustments this admission:  Microbiology results: 11/29 BCx: sent 11/29 MRSA PCR: sent  Thank you for allowing pharmacy to be a part of this patient's care.  GMinda DittoPharmD 01/06/2019 7:44 AM

## 2019-01-06 NOTE — Progress Notes (Signed)
Pharmacy Antibiotic Note  Jonnathan Birman is a 85 y.o. male admitted on 01/05/2019 with sepsis.  Pharmacy has been consulted for cefepime and vancomycin dosing.  Plan: Cefepime 2 Gm IV q8h Vancomycin 1250 mg- need wt Goal AUC = 400-550 F/u scr/cultures/levels     Temp (24hrs), Avg:98.9 F (37.2 C), Min:98.9 F (37.2 C), Max:98.9 F (37.2 C)  Recent Labs  Lab 01/05/19 2334 01/06/19 0112  WBC 14.5*  --   CREATININE 0.95  --   LATICACIDVEN 2.3* 2.0*    CrCl cannot be calculated (Unknown ideal weight.).    No Known Allergies  Antimicrobials this admission: 11/30 cefepime >>  11/30 vancomycin >>   Dose adjustments this admission:   Microbiology results:  BCx:   UCx:    Sputum:    MRSA PCR:   Thank you for allowing pharmacy to be a part of this patient's care.  Dorrene German 01/06/2019 5:54 AM

## 2019-01-06 NOTE — H&P (Signed)
History and Physical    Logan Blackwell ZOX:[id number] DOB: 10/28/1929 DOA: 01/05/2019  PCP: Patient, No Pcp Per  Patient coming from: Patient was brought in from medical group.  Most of the history is obtained from ER physician as unable to reach patient's family at this time.  Patient also has memory issues likely dementia.  Chief Complaint: Weeping wound of the lower extremity.  HPI: Logan Blackwell is a 85 y.o. male with history of AICD placement, A. fib, DVT hypothyroidism hypoalbuminemia CAD [facility] previous history of hemarthrosis who was recently diagnosed with Covid 19 infection [facility] [place] was found to have blood-tinged discharge from his wound on both lower extremity and [facility].  Patient is not able to contribute anything to the history.  But denies any chest pain or shortness of breath.  Patient has a PEG tube.  ED Course: In the ER patient was hypotensive and on exam has bilateral lower extremity wounds with some discharge which were bloody.  Also has extensive swelling of the left ex extremity with erythematous but able to move it.  Pulses are felt.  Patient was started on empiric antibiotics for likely developing sepsis after blood cultures obtained on fluids were started.  Covid test was positive lactic acid was 2.3 WBC 14.5 hemoglobin 9.1 platelets 265 creatinine 0.95 x-ray showing possible infiltrate versus atelectasis with possible small effusion.  Will need to be repeated since the rhythm is not clear.  Review of Systems: As per HPI, rest all negative.   History reviewed. No pertinent past medical history.  Past medical history -likely history of A. fib DVT hypertension CAD cardiomyopathy AICD.  And possible dementia.   reports that he has never smoked. He has never used smokeless tobacco. No history on file for alcohol and drug.  No Known Allergies   Family History  Family history unknown: Yes    Prior to Admission medications   Medication Sig Start Date End Date Taking? Authorizing Provider  acetaminophen (TYLENOL) 325 MG tablet Take 650 mg by mouth every 8 (eight) hours as needed for mild pain, moderate pain, fever or headache.   Yes [provider]  Amino Acids-Protein Hydrolys (FEEDING SUPPLEMENT, PRO-STAT SUGAR FREE 64,) LIQD Take 30 mLs by mouth 3 (three) times daily with meals.   Yes [provider]  aspirin 81 MG chewable tablet Chew 81 mg by mouth daily.   Yes [provider]  atorvastatin (LIPITOR) 40 MG tablet Take 40 mg by mouth at bedtime.   Yes [provider]  emollient (BIAFINE) cream Apply 1 application topically daily.   Yes [provider]  Ferrous Sulfate 220 (44 Fe) MG/5ML LIQD Take 5 mLs by mouth daily.   Yes [provider]  folic acid (FOLVITE) 1 MG tablet Take 1 mg by mouth daily.   Yes [provider]  furosemide (LASIX) 40 MG tablet Take 40 mg by mouth daily.   Yes [provider]  ketoconazole (NIZORAL) 2 % cream Apply 1 application topically 2 (two) times daily.   Yes [provider]  levothyroxine (SYNTHROID) 175 MCG tablet Take 175 mcg by mouth daily before breakfast.   Yes [provider]  Melatonin 3 MG CAPS Take 3 mg by mouth at bedtime.   Yes [provider]  metoprolol tartrate (LOPRESSOR) 25 MG tablet Take 25 mg by mouth 2 (two) times daily.   Yes  [provider]  Multiple Vitamin (MULTIVITAMIN) LIQD Take 5 mLs by mouth daily.   Yes [provider]  pantoprazole sodium (PROTONIX) 40 mg/20 mL PACK Take 40 mg by mouth daily.   Yes [provider]  potassium chloride 20 MEQ/15ML (10%) SOLN Take 20 mEq by mouth daily.   Yes [provider]  sertraline (ZOLOFT) 50 MG tablet Take 50 mg by mouth daily.   Yes [provider]  vitamin B-12 (CYANOCOBALAMIN) 1000 MCG  tablet Take 1,000 mcg by mouth daily.   Yes [provider]    Physical Exam: Constitutional: Moderately built and nourished. Vitals:   01/06/19 0130 01/06/19 0151 01/06/19 0210 01/06/19 0330  BP: (!) 97/51  (!) 101/56 92/74  Pulse: 88  82 (!) 57  Resp: (!) 30  (!) 22 20  Temp:  98.9 F (37.2 C)    TempSrc:  Rectal    SpO2: 98%  98% 100%   Eyes: Anicteric no pallor. ENMT: No discharge from the ears eyes nose or mouth. Neck: No mass or.  No neck rigidity. Respiratory: No rhonchi or crepitations. Cardiovascular: S1-S2 heard. Abdomen: PEG tube in place.  Nontender. Musculoskeletal: Bilateral lower extremity erythema extending up to the knees.  Active discharge.  Left upper extremity is erythematous and swollen.  Pulses full. Skin: Erythema of the both lower extremities and left upper extremity. Neurologic: Alert awake oriented to his name.  Follows commands moves extremities. Psychiatric: Oriented to his name.   Labs on Admission: I have personally reviewed following labs and imaging studies  CBC: Recent Labs  Lab 01/05/19 2334  WBC 14.5*  NEUTROABS 12.7*  HGB 11.1*  HCT 34.9*  MCV 95.9  PLT 265   Basic Metabolic Panel: Recent Labs  Lab 01/05/19 2334  NA 134*  K 3.9  CL 98  CO2 24  GLUCOSE 109*  BUN 46*  CREATININE 0.95  CALCIUM 7.9*   GFR: CrCl cannot be calculated (Unknown ideal weight.). Liver Function Tests: Recent Labs  Lab 01/05/19 2334  AST 43*  ALT 24  ALKPHOS 49  BILITOT 1.0  PROT 6.2*  ALBUMIN 2.3*   No results for input(s): LIPASE, AMYLASE in the last 168 hours. No results for input(s): AMMONIA in the last 168 hours. Coagulation Profile: Recent Labs  Lab 01/05/19 2334  INR 1.1   Cardiac Enzymes: No results for input(s): CKTOTAL, CKMB, CKMBINDEX, TROPONINI in the last 168 hours. BNP (last 3 results) No results for input(s): PROBNP in the last 8760 hours. HbA1C: No results for input(s): HGBA1C in the last 72 hours. CBG:  No results for input(s): GLUCAP in the last 168 hours. Lipid Profile: No results for input(s): CHOL, HDL, LDLCALC, TRIG, CHOLHDL, LDLDIRECT in the last 72 hours. Thyroid Function Tests: No results for input(s): TSH, T4TOTAL, FREET4, T3FREE, THYROIDAB in the last 72 hours. Anemia Panel: No results for input(s): VITAMINB12, FOLATE, FERRITIN, TIBC, IRON, RETICCTPCT in the last 72 hours. Urine analysis:    Component Value Date/Time   COLORURINE YELLOW 01/05/2019 2334   APPEARANCEUR HAZY (A) 01/05/2019 2334   LABSPEC 1.015 01/05/2019 2334   PHURINE 5.0 01/05/2019 2334   GLUCOSEU NEGATIVE 01/05/2019 2334   HGBUR MODERATE (A) 01/05/2019 2334   BILIRUBINUR NEGATIVE 01/05/2019 2334   KETONESUR NEGATIVE 01/05/2019 2334   PROTEINUR NEGATIVE 01/05/2019 2334   NITRITE NEGATIVE 01/05/2019 2334   LEUKOCYTESUR NEGATIVE 01/05/2019 2334   Sepsis Labs: @LABRCNTIP (procalcitonin:4,lacticidven:4) )No results found for this or any previous visit (from the past 240 hour(s)).  Radiological Exams on Admission: Dg Tibia/fibula Left  Result Date: 01/06/2019 CLINICAL DATA:  Bleeding wounds EXAM: LEFT TIBIA AND FIBULA - 2 VIEW COMPARISON:  None. FINDINGS: Diffuse vascular calcifications. No acute bony abnormality. Specifically, no fracture, subluxation, or dislocation. IMPRESSION: No acute bony abnormality. Electronically Signed   By: Rolm Baptise M.D.   On: 01/06/2019 01:17   Dg Tibia/fibula Right  Result Date: 01/06/2019 CLINICAL DATA:  Bleeding wounds EXAM: RIGHT TIBIA AND FIBULA - 2 VIEW COMPARISON:  None. FINDINGS: Diffuse vascular calcifications. No acute bony abnormality. Specifically, no fracture, subluxation, or dislocation. IMPRESSION: No acute bony abnormality. Electronically Signed   By: Rolm Baptise M.D.   On: 01/06/2019 01:16   Dg Chest Port 1 View  Result Date: 01/06/2019 CLINICAL DATA:  COVID positive EXAM: PORTABLE CHEST 1 VIEW COMPARISON:  None. FINDINGS: Left pacer in place with leads  in the right atrium and right ventricle. Aortic calcifications. Low lung volumes. Right infrahilar atelectasis or infiltrate. Left base atelectasis and possible small left effusion versus pleural thickening. Left hilar or infrahilar calcification compatible with old granulomatous disease. IMPRESSION: Bibasilar atelectasis or infiltrates. Possible small left effusion versus pleural thickening. Electronically Signed   By: Rolm Baptise M.D.   On: 01/06/2019 01:18    EKG: Independently reviewed.  Possible sinus rhythm with RBBB.  Assessment/Plan Active Problems:   Sepsis ([place])   COVID-19 virus infection   Cellulitis    1. Sepsis likely source could be from cellulitis of the lower extremity and left upper extremity -CT angiogram of the left upper extremity is pending.  Patient on empiric antibiotics IV fluids.  Get Dopplers of the lower extremity.  Continue with antibiotics. 2. COVID-19 infection -patient not hypoxic.  Will check inflammatory markers.  Presently I do not think patient asymptomatic from COVID-19 alcohol we will closely observe. 3. History of A. fib and DVT -medication list does not show any anticoagulants.  Will need to discuss with family when available. 4. History of hypertension A. fib holding metoprolol due to hypotension. 5. Possible history of dementia will need to confirm with family. 6. PEG tube placement -we will get nutrition support and also x-ray of the abdomen. 7. Anemia no old labs to compare.  Given the septic nature of patient's presentation will need more than 2 midnight stay in inpatient status.   Patient's family is not available at this time to be getting further direction about his CODE STATUS.  Will need to get further history from family when available also medication list.   DVT prophylaxis: Heparin. Code Status: Full code for now we will need to confirm with the patient's family when available. Family Communication: No communication number with the  family available. Disposition Plan: To be determined. Consults called: Wound team. Admission status: Inpatient.   Rise Patience MD [facility] Pager [phone].  If 7PM-7AM, please contact night-coverage [email or web address] Password [email or web address]  01/06/2019, 5:42 AM

## 2019-01-07 ENCOUNTER — Inpatient Hospital Stay (HOSPITAL_COMMUNITY): Payer: Medicare Other

## 2019-01-07 ENCOUNTER — Encounter (HOSPITAL_COMMUNITY): Payer: Medicare Other

## 2019-01-07 DIAGNOSIS — B9689 Other specified bacterial agents as the cause of diseases classified elsewhere: Secondary | ICD-10-CM

## 2019-01-07 DIAGNOSIS — R7881 Bacteremia: Secondary | ICD-10-CM

## 2019-01-07 DIAGNOSIS — U071 COVID-19: Secondary | ICD-10-CM

## 2019-01-07 DIAGNOSIS — Z95810 Presence of automatic (implantable) cardiac defibrillator: Secondary | ICD-10-CM

## 2019-01-07 DIAGNOSIS — L0390 Cellulitis, unspecified: Secondary | ICD-10-CM

## 2019-01-07 DIAGNOSIS — I4891 Unspecified atrial fibrillation: Secondary | ICD-10-CM

## 2019-01-07 DIAGNOSIS — I2510 Atherosclerotic heart disease of native coronary artery without angina pectoris: Secondary | ICD-10-CM

## 2019-01-07 DIAGNOSIS — F0390 Unspecified dementia without behavioral disturbance: Secondary | ICD-10-CM

## 2019-01-07 DIAGNOSIS — D72829 Elevated white blood cell count, unspecified: Secondary | ICD-10-CM

## 2019-01-07 DIAGNOSIS — L309 Dermatitis, unspecified: Secondary | ICD-10-CM

## 2019-01-07 DIAGNOSIS — Z931 Gastrostomy status: Secondary | ICD-10-CM

## 2019-01-07 DIAGNOSIS — L03114 Cellulitis of left upper limb: Secondary | ICD-10-CM

## 2019-01-07 DIAGNOSIS — K0889 Other specified disorders of teeth and supporting structures: Secondary | ICD-10-CM

## 2019-01-07 DIAGNOSIS — L8990 Pressure ulcer of unspecified site, unspecified stage: Secondary | ICD-10-CM | POA: Insufficient documentation

## 2019-01-07 LAB — CBC WITH DIFFERENTIAL/PLATELET
Abs Immature Granulocytes: 0.09 10*3/uL — ABNORMAL HIGH (ref 0.00–0.07)
Basophils Absolute: 0 10*3/uL (ref 0.0–0.1)
Basophils Relative: 0 %
Eosinophils Absolute: 0 10*3/uL (ref 0.0–0.5)
Eosinophils Relative: 0 %
HCT: 30.7 % — ABNORMAL LOW (ref 39.0–52.0)
Hemoglobin: 9.5 g/dL — ABNORMAL LOW (ref 13.0–17.0)
Immature Granulocytes: 1 %
Lymphocytes Relative: 8 %
Lymphs Abs: 0.7 10*3/uL (ref 0.7–4.0)
MCH: 30.4 pg (ref 26.0–34.0)
MCHC: 30.9 g/dL (ref 30.0–36.0)
MCV: 98.4 fL (ref 80.0–100.0)
Monocytes Absolute: 0.4 10*3/uL (ref 0.1–1.0)
Monocytes Relative: 4 %
Neutro Abs: 7.3 10*3/uL (ref 1.7–7.7)
Neutrophils Relative %: 87 %
Platelets: 261 10*3/uL (ref 150–400)
RBC: 3.12 MIL/uL — ABNORMAL LOW (ref 4.22–5.81)
RDW: 16.3 % — ABNORMAL HIGH (ref 11.5–15.5)
WBC: 8.5 10*3/uL (ref 4.0–10.5)
nRBC: 0 % (ref 0.0–0.2)

## 2019-01-07 LAB — RESPIRATORY PANEL BY PCR

## 2019-01-07 LAB — IRON AND TIBC
Iron: 58 ug/dL (ref 45–182)
Saturation Ratios: 47 % — ABNORMAL HIGH (ref 17.9–39.5)
TIBC: 125 ug/dL — ABNORMAL LOW (ref 250–450)
UIBC: 67 ug/dL

## 2019-01-07 LAB — COMPREHENSIVE METABOLIC PANEL
ALT: 23 U/L (ref 0–44)
AST: 42 U/L — ABNORMAL HIGH (ref 15–41)
Albumin: 2 g/dL — ABNORMAL LOW (ref 3.5–5.0)
Alkaline Phosphatase: 44 U/L (ref 38–126)
Anion gap: 9 (ref 5–15)
BUN: 45 mg/dL — ABNORMAL HIGH (ref 8–23)
CO2: 25 mmol/L (ref 22–32)
Calcium: 7.7 mg/dL — ABNORMAL LOW (ref 8.9–10.3)
Chloride: 106 mmol/L (ref 98–111)
Creatinine, Ser: 0.68 mg/dL (ref 0.61–1.24)
GFR calc Af Amer: >60 mL/min (ref >60)
GFR calc non Af Amer: >60 mL/min (ref >60)
Glucose, Bld: 131 mg/dL — ABNORMAL HIGH (ref 70–99)
Potassium: 2.9 mmol/L — ABNORMAL LOW (ref 3.5–5.1)
Sodium: 140 mmol/L (ref 135–145)
Total Bilirubin: 0.7 mg/dL (ref 0.3–1.2)
Total Protein: 5.6 g/dL — ABNORMAL LOW (ref 6.5–8.1)

## 2019-01-07 LAB — INFLUENZA PANEL BY PCR (TYPE A & B)
Influenza A By PCR: NEGATIVE
Influenza B By PCR: NEGATIVE

## 2019-01-07 LAB — GLUCOSE, CAPILLARY
Glucose-Capillary: 123 mg/dL — ABNORMAL HIGH (ref 70–99)
Glucose-Capillary: 126 mg/dL — ABNORMAL HIGH (ref 70–99)
Glucose-Capillary: 118 mg/dL — ABNORMAL HIGH (ref 70–99)
Glucose-Capillary: 124 mg/dL — ABNORMAL HIGH (ref 70–99)
Glucose-Capillary: 139 mg/dL — ABNORMAL HIGH (ref 70–99)

## 2019-01-07 LAB — MAGNESIUM: Magnesium: 2 mg/dL (ref 1.7–2.4)

## 2019-01-07 LAB — C-REACTIVE PROTEIN: CRP: 18.3 mg/dL — ABNORMAL HIGH (ref <1.0)

## 2019-01-07 LAB — D-DIMER, QUANTITATIVE: D-Dimer, Quant: 1.29 ug/mL-FEU — ABNORMAL HIGH (ref 0.00–0.50)

## 2019-01-07 LAB — PHOSPHORUS: Phosphorus: 2.2 mg/dL — ABNORMAL LOW (ref 2.5–4.6)

## 2019-01-07 LAB — FERRITIN: Ferritin: 2061 ng/mL — ABNORMAL HIGH (ref 24–336)

## 2019-01-07 MED ORDER — ORAL CARE MOUTH RINSE
15.0000 mL | Freq: Two times a day (BID) | OROMUCOSAL | Status: DC
  Administered 2019-01-07 – 2019-01-10 (×8): 15 mL via OROMUCOSAL

## 2019-01-07 MED ORDER — CHLORHEXIDINE GLUCONATE 0.12 % MT SOLN
15.0000 mL | Freq: Two times a day (BID) | OROMUCOSAL | Status: DC
  Administered 2019-01-07 – 2019-01-11 (×9): 15 mL via OROMUCOSAL
  Filled 2019-01-07 (×7): qty 15

## 2019-01-07 MED ORDER — POTASSIUM CHLORIDE 20 MEQ/15ML (10%) PO SOLN
40.0000 meq | ORAL | Status: AC
  Administered 2019-01-07 (×2): 40 meq via ORAL
  Filled 2019-01-07 (×2): qty 30

## 2019-01-07 NOTE — Progress Notes (Signed)
Left arm swollen, piv flushes but does not get blood return. New piv started in right arm and left piv d/c'd. Will notify MD

## 2019-01-07 NOTE — Progress Notes (Signed)
PROGRESS NOTE    Logan Blackwell Halleck   ZOX:[id number]  DOB: 03/27/1929  DOA: 01/05/2019 PCP: Patient, No Pcp Per   Brief Narrative:  Logan Blackwell Demetrius is a 85 y.o. male who resides in a SNF with history of AICD placement, A. fib, DVT hypothyroidism, chronic back pain, CAD,  H/o of hemarthrosis admitted from July to Aug for septic shock and subsequently developed cognitive impairment, received a PEG tube (in Aug). He recently diagnosed with Covid 19 infection and because of this he was transferred to a SNF in [place] from [facility] in [place]. He was found to have blood-tinged discharge from his wounds on  lower extremity and was transferred to [facility].  Patient is not able to contribute anything to the history.     CXR in ED> Bibasilar atelectasis or infiltrates. Possible small left effusion versus pleural thickening.   Subjective: No complaints other than feeling cold.     Assessment & Plan:   Principal Problem:   Severe Sepsis with tachycardia, leukocytosis, lactic acidosis and elevated procalcitonin - suspected to be due to cellulitis and also COVID 19 - on exam, noted to have extensive erythema of his left arm- CT of this arm suggestive of cellulitis and myofasciitis  - 1 set of blood cultures noted to be + with gram neg rods-  BCID > Acinetobacter baumanii  - has numerous skin tears and wounds which could have lead to the cellulitis and subsequent bacteremia - cont Primaxin and Vanc for now - will ask for ID opinion on antibiotics  Active Problems:     COVID-19 virus infection - not hypoxic- initial CXR suggestive of lung infiltrates - will repeat today - follow inflammatory markers  Hypokalemia - replacing K via PEG tube today- no diarrhea noted per nursing staff - Mg is normal    A-fib  - not on anticoagulation at baseline - cont Metoprolol  AICD (automatic cardioverter/defibrillator) present  Chronic diastolic CHF- dehydration - on Lasix as  outpt which is currently on hold due to dehydration (hyponatremia, elevated BUN/Cr ratio and dehydration noted on exam when he first arrived)  PEG (percutaneous endoscopic gastrostomy) status  - tube feeds resumed along with free water  Acute encephalopathy superimposed on baseline cognitive issues - likely due to acute illness- following  Hypothyroid - cont Synthroid   Anemia -  anemia panel  GERD? -cont Protonix  Extensive wounds and skin tears - I have asked for wound care to assist with managing these-   Bed bound with PEG and Cognitive impairment - since last admission in July/Aug for septic shock  Code Status - DNR per sons  Time spent in minutes: 45 min- extensive conversation with his son Fernand ParkinsRoger Areola about current condition and Code status   DVT prophylaxis:  Heparin Code Status: Full code Family Communication: Fernand Parkinsoger Osorto Disposition Plan: cont to follow for clinical improvement, will return to SNF when improved Consultants:   none Procedures:  2 D ECHO 1. Left ventricular ejection fraction, by visual estimation, is 50 to 55%. The left ventricle has normal function. There is severely increased left ventricular hypertrophy.  2. Elevated left atrial pressure.  3. Indeterminate diastolic filling due to E-A fusion.  4. Poor acoustic windows limit study.  5. Global right ventricle has normal systolic function.The right ventricular size is normal. No increase in right ventricular wall thickness.  6. Left atrial size was normal.  7. Right atrial size was normal.  8. The mitral valve is grossly normal. Mild  mitral valve regurgitation.  9. The tricuspid valve is normal in structure. Tricuspid valve regurgitation is mild. 10. The aortic valve is tricuspid. Aortic valve regurgitation is mild. Mild aortic valve stenosis. 11. The pulmonic valve was grossly normal. Pulmonic valve regurgitation is mild. 12. Mildly elevated pulmonary artery systolic  pressure.  Antimicrobials:  Anti-infectives (From admission, onward)   Start     Dose/Rate Route Frequency Ordered Stop   01/06/19 2200  vancomycin (VANCOCIN) 1,500 mg in sodium chloride 0.9 % 500 mL IVPB     1,500 mg 250 mL/hr over 120 Minutes Intravenous Every 24 hours 01/06/19 1055     01/06/19 1800  imipenem-cilastatin (PRIMAXIN) 500 mg in sodium chloride 0.9 % 100 mL IVPB     500 mg 200 mL/hr over 30 Minutes Intravenous Every 6 hours 01/06/19 1708     01/06/19 1000  ceFEPIme (MAXIPIME) 2 g in sodium chloride 0.9 % 100 mL IVPB  Status:  Discontinued     2 g 200 mL/hr over 30 Minutes Intravenous Every 8 hours 01/06/19 0558 01/06/19 1701   01/06/19 0115  vancomycin (VANCOCIN) 1,250 mg in sodium chloride 0.9 % 250 mL IVPB     1,250 mg 166.7 mL/hr over 90 Minutes Intravenous  Once 01/06/19 0045 01/06/19 0327   01/06/19 0045  ceFEPIme (MAXIPIME) 2 g in sodium chloride 0.9 % 100 mL IVPB     2 g 200 mL/hr over 30 Minutes Intravenous NOW 01/06/19 0043 01/06/19 0152       Objective: Vitals:   01/07/19 0700 01/07/19 0800 01/07/19 0900 01/07/19 1000  BP: (!) 94/26 (!) 129/53 129/65 (!) 117/99  Pulse: 76 73 72 (!) 110  Resp: Temp:  (!) 97.1 F (36.2 C)    TempSrc:  Axillary    SpO2: 98% 97% 92% 97%  Weight:      Height:        Intake/Output Summary (Last 24 hours) at 01/07/2019 1033 Last data filed at 01/07/2019 1000 Gross per 24 hour  Intake 3428.66 ml  Output --  Net 3428.66 ml   Filed Weights   01/06/19 0652 01/06/19 0800  Weight: 87.7 kg 87.1 kg    Examination: General exam: Appears comfortable  HEENT: PERRLA, oral mucosa moist, no sclera icterus or thrush Respiratory system: Clear to auscultation. Respiratory effort normal. Cardiovascular system: S1 & S2 heard, RRR.   Gastrointestinal system: Abdomen soft, non-tender, nondistended. Normal bowel sounds. Central nervous system: Alert and oriented only to person. No focal neurological  deficits. Extremities: No cyanosis, clubbing or edema Skin: numerous skin tears and pressure ulcers     Data Reviewed: I have personally reviewed following labs and imaging studies  CBC: Recent Labs  Lab 01/05/19 2334 01/06/19 0648 01/07/19 0210  WBC 14.5* 13.1* 8.5  NEUTROABS 12.7* 11.3* 7.3  HGB 11.1* 10.4* 9.5*  HCT 34.9* 32.4* 30.7*  MCV 95.9 95.9 98.4  PLT 265 227 261   Basic Metabolic Panel: Recent Labs  Lab 01/05/19 2334 01/06/19 0648 01/07/19 0210  NA 134* 134* 140  K 3.9 3.6 2.9*  CL 98 100 106  CO2 GLUCOSE 109* 105* 131*  BUN 46* 47* 45*  CREATININE 0.95 0.87 0.68  CALCIUM 7.9* 7.9* 7.7*  MG  --   --  2.0  PHOS  --   --  2.2*   GFR: Estimated Creatinine Clearance: 68.6 mL/min (by C-G formula based on SCr of 0.68 mg/dL). Liver Function Tests: Recent Labs  Lab  01/05/19 2334 01/06/19 0648 01/07/19 0210  AST 43* 42* 42*  ALT ALKPHOS 49 45 44  BILITOT 1.0 0.8 0.7  PROT 6.2* 5.8* 5.6*  ALBUMIN 2.3* 2.1* 2.0*   No results for input(s): LIPASE, AMYLASE in the last 168 hours. No results for input(s): AMMONIA in the last 168 hours. Coagulation Profile: Recent Labs  Lab 01/05/19 2334  INR 1.1   Cardiac Enzymes: No results for input(s): CKTOTAL, CKMB, CKMBINDEX, TROPONINI in the last 168 hours. BNP (last 3 results) No results for input(s): PROBNP in the last 8760 hours. HbA1C: No results for input(s): HGBA1C in the last 72 hours. CBG: Recent Labs  Lab 01/06/19 1636 01/06/19 2026 01/07/19 0300 01/07/19 0813  GLUCAP 139* 137* 123* 126*   Lipid Profile: No results for input(s): CHOL, HDL, LDLCALC, TRIG, CHOLHDL, LDLDIRECT in the last 72 hours. Thyroid Function Tests: No results for input(s): TSH, T4TOTAL, FREET4, T3FREE, THYROIDAB in the last 72 hours. Anemia Panel: Recent Labs    01/07/19 0210  FERRITIN 2,061*   Urine analysis:    Component Value Date/Time   COLORURINE YELLOW 01/05/2019 2334   APPEARANCEUR HAZY  (A) 01/05/2019 2334   LABSPEC 1.015 01/05/2019 2334   PHURINE 5.0 01/05/2019 2334   GLUCOSEU NEGATIVE 01/05/2019 2334   HGBUR MODERATE (A) 01/05/2019 2334   BILIRUBINUR NEGATIVE 01/05/2019 2334   KETONESUR NEGATIVE 01/05/2019 2334   PROTEINUR NEGATIVE 01/05/2019 2334   NITRITE NEGATIVE 01/05/2019 2334   LEUKOCYTESUR NEGATIVE 01/05/2019 2334   Sepsis Labs: (procalcitonin:4,lacticidven:4) ) Recent Results (from the past 240 hour(s))  Blood Culture (routine x 2)     Status: Abnormal (Preliminary result)   Collection Time: 01/05/19 11:34 PM   Specimen: BLOOD  Result Value Ref Range Status   Specimen Description   Final    BLOOD LEFT ANTECUBITAL Performed at [facility], [address], [place], [place] [address]    Special Requests   Final    BOTTLES DRAWN AEROBIC AND ANAEROBIC Blood Culture adequate volume Performed at [facility], [address] [place], [place] [address]    Culture  Setup Time   Final    GRAM NEGATIVE RODS AEROBIC BOTTLE ONLY CRITICAL RESULT CALLED TO, READ BACK BY AND VERIFIED WITH: Earlean Shawl PharmD 15:50 01/06/19 ([email or web address])    Culture (A)  Final    ACINETOBACTER BAUMANNII SUSCEPTIBILITIES TO FOLLOW Performed at [facility], [address], [place], [place] [address]    Report Status PENDING  Incomplete  Urine culture     Status: None   Collection Time: 01/05/19 11:34 PM   Specimen: Urine, Catheterized  Result Value Ref Range Status   Specimen Description   Final    URINE, CATHETERIZED Performed at [facility], [address] [place], [place] [address]    Special Requests   Final    NONE Performed at [facility], [address] [place], [place] [address]    Culture   Final    NO GROWTH Performed at [facility], [address] [place], [place] [address]    Report Status 01/06/2019 FINAL  Final  Blood Culture ID Panel (Reflexed)     Status:  Abnormal   Collection Time: 01/05/19 11:34 PM  Result Value Ref Range Status   Enterococcus species NOT DETECTED NOT DETECTED Final   Listeria monocytogenes NOT DETECTED NOT DETECTED Final   Staphylococcus species NOT DETECTED NOT DETECTED Final   Staphylococcus aureus (  BCID) NOT DETECTED NOT DETECTED Final   Streptococcus species NOT DETECTED NOT DETECTED Final   Streptococcus agalactiae NOT DETECTED NOT DETECTED Final   Streptococcus pneumoniae NOT DETECTED NOT DETECTED Final   Streptococcus pyogenes NOT DETECTED NOT DETECTED Final   Acinetobacter baumannii DETECTED (A) NOT DETECTED Final    Comment: CRITICAL RESULT CALLED TO, READ BACK BY AND VERIFIED WITH: Melodye Ped PharmD 15:50 01/06/19 ([email or web address])    Enterobacteriaceae species NOT DETECTED NOT DETECTED Final   Enterobacter cloacae complex NOT DETECTED NOT DETECTED Final   Escherichia coli NOT DETECTED NOT DETECTED Final   Klebsiella oxytoca NOT DETECTED NOT DETECTED Final   Klebsiella pneumoniae NOT DETECTED NOT DETECTED Final   Proteus species NOT DETECTED NOT DETECTED Final   Serratia marcescens NOT DETECTED NOT DETECTED Final   Carbapenem resistance NOT DETECTED NOT DETECTED Final   Haemophilus influenzae NOT DETECTED NOT DETECTED Final   Neisseria meningitidis NOT DETECTED NOT DETECTED Final   Pseudomonas aeruginosa NOT DETECTED NOT DETECTED Final   Candida albicans NOT DETECTED NOT DETECTED Final   Candida glabrata NOT DETECTED NOT DETECTED Final   Candida krusei NOT DETECTED NOT DETECTED Final   Candida parapsilosis NOT DETECTED NOT DETECTED Final   Candida tropicalis NOT DETECTED NOT DETECTED Final    Comment: Performed at [facility], [place], [place], [place] [address]  MRSA PCR Screening     Status: None   Collection Time: 01/06/19  8:20 AM   Specimen: Nasal Mucosa; Nasopharyngeal  Result Value Ref Range Status   MRSA by PCR NEGATIVE NEGATIVE Final    Comment:        The GeneXpert MRSA Assay  (FDA approved for NASAL specimens only), is one component of a comprehensive MRSA colonization surveillance program. It is not intended to diagnose MRSA infection nor to guide or monitor treatment for MRSA infections. Performed at [facility], [place] [address] [place], [place] [address]   Culture, blood (routine x 2)     Status: None (Preliminary result)   Collection Time: 01/06/19  5:34 PM   Specimen: Right Antecubital; Blood  Result Value Ref Range Status   Specimen Description   Final    RIGHT ANTECUBITAL Performed at [place] [address], [place], [place] [address]    Special Requests   Final    BOTTLES DRAWN AEROBIC ONLY Blood Culture adequate volume Performed at [place] [address], [place], [place] [address]    Culture   Final    NO GROWTH < 24 HOURS Performed at [place] [address], [place], [place] [address]    Report Status PENDING  Incomplete  Culture, blood (routine x 2)     Status: None (Preliminary result)   Collection Time: 01/06/19  5:44 PM   Specimen: Right Antecubital; Blood  Result Value Ref Range Status   Specimen Description   Final    RIGHT ANTECUBITAL Performed at [place] [address], [place], [place] [address]    Special Requests   Final    BOTTLES DRAWN AEROBIC AND ANAEROBIC Blood Culture adequate volume Performed at [place] [address], [place],  [address]    Culture   Final    NO GROWTH < 12 HOURS Performed at [place] [address], [place],  [address]    Report Status PENDING  Incomplete         Radiology Studies:  Dg Tibia/fibula Left  Result Date: 01/06/2019 CLINICAL DATA:  Bleeding wounds EXAM: LEFT TIBIA AND FIBULA - 2 VIEW COMPARISON:  None. FINDINGS: Diffuse vascular calcifications. No acute bony abnormality. Specifically, no fracture, subluxation, or  dislocation. IMPRESSION: No acute bony abnormality. Electronically Signed   By: Charlett Nose M.D.   On: 01/06/2019 01:17   Dg Tibia/fibula Right  Result Date: 01/06/2019 CLINICAL DATA:  Bleeding wounds EXAM: RIGHT TIBIA AND FIBULA - 2 VIEW COMPARISON:  None. FINDINGS: Diffuse vascular calcifications. No acute bony abnormality. Specifically, no fracture, subluxation, or dislocation. IMPRESSION: No acute bony abnormality. Electronically Signed   By: Charlett Nose M.D.   On: 01/06/2019 01:16   Dg Chest Port 1 View  Result Date: 01/06/2019 CLINICAL DATA:  COVID positive EXAM: PORTABLE CHEST 1 VIEW COMPARISON:  None. FINDINGS: Left pacer in place with leads in the right atrium and right ventricle. Aortic calcifications. Low lung volumes. Right infrahilar atelectasis or infiltrate. Left base atelectasis and possible small left effusion versus pleural thickening. Left hilar or infrahilar calcification compatible with old granulomatous disease. IMPRESSION: Bibasilar atelectasis or infiltrates. Possible small left effusion versus pleural thickening. Electronically Signed   By: Charlett Nose M.D.   On: 01/06/2019 01:18   Ct Extrem Up Entire Arm L W/cm  Result Date: 01/06/2019 CLINICAL DATA:  Left upper extremity pain and swelling. EXAM: CT OF THE UPPER LEFT EXTREMITY WITH CONTRAST TECHNIQUE: Multidetector CT imaging of the upper left extremity was performed according to the standard protocol following intravenous contrast administration. CONTRAST:  OMNIPAQUE IOHEXOL 300 MG/ML  SOLN COMPARISON:  None. FINDINGS: Diffuse subcutaneous soft tissue swelling/edema/fluid most notably involving the forearm area. No discrete rim enhancing fluid collection to suggest a drainable soft tissue abscess. Suspect myofasciitis involving the forearm musculature without obvious changes of pyomyositis. Degenerative changes are noted at the shoulder, elbow, wrist and hand and hip joints. No obvious destructive bony changes to  suggest osteomyelitis. No obvious CT findings for septic arthritis. Severe rotator cuff disease. Advanced vascular calcifications. Incidental note is made of a small left pleural effusion and overlying atelectasis. Calcified mediastinal lymph nodes are noted. IMPRESSION: 1. Cellulitis and myofasciitis involving the left forearm in particular but no definite CT findings for discrete drainable subcutaneous abscess or pyomyositis. 2. Advanced degenerative changes involving all of the joints but no obvious CT findings to suggest septic arthritis or osteomyelitis. 3. Severe/advanced vascular disease. 4. Small left pleural effusion and overlying atelectasis. Electronically Signed   By: Rudie Meyer M.D.   On: 01/06/2019 06:37      Scheduled Meds:  aspirin  81 mg Oral Daily   atorvastatin  40 mg Oral QHS   chlorhexidine  15 mL Mouth Rinse BID   Chlorhexidine Gluconate Cloth  6 each Topical Daily   feeding supplement (PRO-STAT SUGAR FREE 64)  30 mL Per Tube TID   ferrous sulfate  300 mg Oral Daily   folic acid  1 mg Oral Daily   free water  100 mL Per Tube Q6H   Gerhardt's butt cream   Topical TID   heparin  5,000 Units Subcutaneous Q8H   levothyroxine  175 mcg Oral Q0600   mouth rinse  15 mL Mouth Rinse q12n4p   metoprolol tartrate  25 mg Oral BID   multivitamin  15 mL Oral Daily   pantoprazole sodium  40 mg Oral Daily   potassium chloride  40 mEq Oral Q4H   sertraline  50 mg Oral Daily   vitamin B-12  1,000 mcg Oral Daily   Continuous Infusions:  feeding supplement (OSMOLITE 1.5 CAL) 1,000 mL (01/06/19 1325)   imipenem-cilastatin Stopped (01/07/19 0850)   vancomycin Stopped (01/07/19 0053)     LOS: 1 day      Calvert Cantor, MD [facility] Pager: [email or web address] Password [facility] 01/07/2019, 10:33 AM

## 2019-01-07 NOTE — TOC Initial Note (Signed)
Transition of Care [facility]) - Initial/Assessment Note    Patient Details  Name: Logan Blackwell MRN: [id number] Date of Birth: 20-Jul-1929  [facility] CM/SW Contact:    Nelwyn Salisbury, LCSW Phone Number: [phone] 01/07/2019, 11:48 AM  Clinical Narrative:     Pt admitted with sepsis/cellulitis, covid19+, from [facility]. He had just arrived there transferred from sister facility [facility] in [place] where he resides. (Transferred due to covid+ protocol for this community.)  Spoke with son who provided brief overview of pt's medical/social hx the past few months- was living at home independently until this summer 2020, had a fall and was down for several hours before son found him- admitted to hospital there and had complicated course- son states pt ended up electing to have PEG tube placed and transition to nursing home. Initially was hoping to rehabilitate but due to skin conditions has not been able to participate in therapy since August son reports and has essentially been bed-bound since that time. Transitioned to long term care and qualified for Medicaid. TOC team will follow to assist with disposition when pt clinically stable- expect return to [facility]. Notified SNF.              Expected Discharge Plan: Skilled Nursing Facility Barriers to Discharge: Other (comment)(medical stability)   Patient Goals and CMS Choice Patient states their goals for this hospitalization and ongoing recovery are:: did not participate      Expected Discharge Plan and Services Expected Discharge Plan: Skilled Nursing Facility In-house Referral: Clinical Social Work     Living arrangements for the past 2 months: Skilled Nursing Facility                                      Prior Living Arrangements/Services Living arrangements for the past 2 months: Skilled Nursing Facility Lives with:: Facility Resident Patient language and need for interpreter reviewed::  Yes Do you feel safe going back to the place where you live?: Yes      Need for Family Participation in Patient Care: Yes (Comment)(son) Care giver support system in place?: Yes (comment)(SNF resident) Current home services: DME Criminal Activity/Legal Involvement Pertinent to Current Situation/Hospitalization: No - Comment as needed  Activities of Daily Living Home Assistive Devices/Equipment: Grab bars around toilet, Grab bars in shower, Hand-held shower hose, Hospital bed, Blood pressure cuff, Nebulizer, Oxygen, Wheelchair, Feeding equipment, Other (Comment)([facility] has necessary equipment for their residents) ADL Screening (condition at time of admission) Patient's cognitive ability adequate to safely complete daily activities?: No Is the patient deaf or have difficulty hearing?: No Does the patient have difficulty seeing, even when wearing glasses/contacts?: No Does the patient have difficulty concentrating, remembering, or making decisions?: Yes Patient able to express need for assistance with ADLs?: No Does the patient have difficulty dressing or bathing?: Yes Independently performs ADLs?: No Communication: Independent Dressing (OT): Dependent Is this a change from baseline?: Pre-admission baseline Grooming: Dependent Is this a change from baseline?: Pre-admission baseline Feeding: Dependent Is this a change from baseline?: Pre-admission baseline Bathing: Dependent Is this a change from baseline?: Pre-admission baseline Toileting: Dependent Is this a change from baseline?: Pre-admission baseline In/Out Bed: Dependent Is this a change from baseline?: Pre-admission baseline Walks in Home: Dependent Is this a change from baseline?: Pre-admission baseline Does the patient have difficulty walking or climbing stairs?: Yes(secondary to weakness) Weakness of Legs:  Both Weakness of Arms/Hands: Both  Permission Sought/Granted Permission sought to share information with  : Family Supports Permission granted to share information with : Yes, Verbal Permission Granted  Share Information with NAME: son Francee Piccolo [phone]  Permission granted to share info w AGENCY: [facility] [phone]        Emotional Assessment              Admission diagnosis:  Cellulitis of left arm [L03.114] Open leg wound [S81.809A] Sepsis without acute organ dysfunction, due to unspecified organism ([place]) [A41.9] Sepsis ([place]) [A41.9] Patient Active Problem List   Diagnosis Date Noted  . Pressure injury of skin 01/07/2019  . A-fib ([place]) 01/07/2019  . AICD (automatic cardioverter/defibrillator) present 01/07/2019  . PEG (percutaneous endoscopic gastrostomy) status ([place]) 01/07/2019  . Sepsis ([place]) 01/06/2019  . COVID-19 virus infection 01/06/2019  . Cellulitis 01/06/2019  . Open leg wound    PCP:  Patient, No Pcp Per Pharmacy:  No Pharmacies Listed    Social Determinants of Health (SDOH) Interventions    Readmission Risk Interventions No flowsheet data found.

## 2019-01-07 NOTE — Consult Note (Addendum)
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 infiltrate. Left base atelectasis and possible small left effusion versus pleural thickening. Left hilar or infrahilar calcification compatible with old granulomatous disease. IMPRESSION: Bibasilar atelectasis or infiltrates. Possible small left effusion versus pleural thickening. Electronically Signed   By: Rolm Baptise M.D.   On: 01/06/2019 01:18   Ct Extrem Up Entire Arm L W/cm  Result Date:  01/06/2019 CLINICAL DATA:  Left upper extremity pain and swelling. EXAM: CT OF THE UPPER LEFT EXTREMITY WITH CONTRAST TECHNIQUE: Multidetector CT imaging of the upper left extremity was performed according to the standard protocol following intravenous contrast administration. CONTRAST:  177m OMNIPAQUE IOHEXOL 300 MG/ML  SOLN COMPARISON:  None. FINDINGS: Diffuse subcutaneous soft tissue swelling/edema/fluid most notably involving the forearm area. No discrete rim enhancing fluid collection to suggest a drainable soft tissue abscess. Suspect myofasciitis involving the forearm musculature without obvious changes of pyomyositis. Degenerative changes are noted at the shoulder, elbow, wrist and hand and hip joints. No obvious destructive bony changes to suggest osteomyelitis. No obvious CT findings for septic arthritis. Severe rotator cuff disease. Advanced vascular calcifications. Incidental note is made of a small left pleural effusion and overlying atelectasis. Calcified mediastinal lymph nodes are noted. IMPRESSION: 1. Cellulitis and myofasciitis involving the left forearm in particular but no definite CT findings for discrete drainable subcutaneous abscess or pyomyositis. 2. Advanced degenerative changes involving all of the joints but no obvious CT findings to suggest septic arthritis or osteomyelitis. 3. Severe/advanced vascular disease. 4. Small left pleural effusion and overlying atelectasis. Electronically Signed   By: PMarijo SanesM.D.   On: 01/06/2019 06:37    Assessment/Plan: [id number] with limited past medical history, in setting of dementia, transferred from SNF to acute care hospital due to condition of skin/cellulitis found to possibly acinetobacter bacteremia in addition to be monitored for covid-19 remains on room air.  covid-19 = appears mild disease. Continue to follow markers  Bacteremia = continue on meropenem until we get sensitivities  Dermatitis +/- cellulitis = recommend biopsy to see if if this is cutaneous t cell lymphoma vs other pathology. Follow wound care recs. Can discontinue vancomycin if no new positive cultures arise tomorrow

## 2019-01-08 ENCOUNTER — Encounter (HOSPITAL_COMMUNITY): Payer: Medicare Other

## 2019-01-08 ENCOUNTER — Inpatient Hospital Stay (HOSPITAL_COMMUNITY): Payer: Medicare Other

## 2019-01-08 DIAGNOSIS — R609 Edema, unspecified: Secondary | ICD-10-CM

## 2019-01-08 LAB — COMPREHENSIVE METABOLIC PANEL
ALT: 25 U/L (ref 0–44)
AST: 35 U/L (ref 15–41)
Albumin: 2 g/dL — ABNORMAL LOW (ref 3.5–5.0)
Alkaline Phosphatase: 45 U/L (ref 38–126)
Anion gap: 9 (ref 5–15)
BUN: 39 mg/dL — ABNORMAL HIGH (ref 8–23)
CO2: 23 mmol/L (ref 22–32)
Calcium: 7.8 mg/dL — ABNORMAL LOW (ref 8.9–10.3)
Chloride: 112 mmol/L — ABNORMAL HIGH (ref 98–111)
Creatinine, Ser: 0.59 mg/dL — ABNORMAL LOW (ref 0.61–1.24)
GFR calc Af Amer: >60 mL/min (ref >60)
GFR calc non Af Amer: >60 mL/min (ref >60)
Glucose, Bld: 104 mg/dL — ABNORMAL HIGH (ref 70–99)
Potassium: 4.3 mmol/L (ref 3.5–5.1)
Sodium: 144 mmol/L (ref 135–145)
Total Bilirubin: 0.7 mg/dL (ref 0.3–1.2)
Total Protein: 5.7 g/dL — ABNORMAL LOW (ref 6.5–8.1)

## 2019-01-08 LAB — FOLATE: Folate: 16.2 ng/mL (ref >5.9)

## 2019-01-08 LAB — CBC WITH DIFFERENTIAL/PLATELET
Abs Immature Granulocytes: 0.22 10*3/uL — ABNORMAL HIGH (ref 0.00–0.07)
Basophils Absolute: 0 10*3/uL (ref 0.0–0.1)
Basophils Relative: 1 %
Eosinophils Absolute: 0.1 10*3/uL (ref 0.0–0.5)
Eosinophils Relative: 1 %
HCT: 29 % — ABNORMAL LOW (ref 39.0–52.0)
Hemoglobin: 8.9 g/dL — ABNORMAL LOW (ref 13.0–17.0)
Immature Granulocytes: 4 %
Lymphocytes Relative: 13 %
Lymphs Abs: 0.7 10*3/uL (ref 0.7–4.0)
MCH: 30.1 pg (ref 26.0–34.0)
MCHC: 30.7 g/dL (ref 30.0–36.0)
MCV: 98 fL (ref 80.0–100.0)
Monocytes Absolute: 0.5 10*3/uL (ref 0.1–1.0)
Monocytes Relative: 9 %
Neutro Abs: 4 10*3/uL (ref 1.7–7.7)
Neutrophils Relative %: 72 %
Platelets: 342 10*3/uL (ref 150–400)
RBC: 2.96 MIL/uL — ABNORMAL LOW (ref 4.22–5.81)
RDW: 16.4 % — ABNORMAL HIGH (ref 11.5–15.5)
WBC: 5.6 10*3/uL (ref 4.0–10.5)
nRBC: 0 % (ref 0.0–0.2)

## 2019-01-08 LAB — RETICULOCYTES
Immature Retic Fract: 11.3 % (ref 2.3–15.9)
RBC.: 2.96 MIL/uL — ABNORMAL LOW (ref 4.22–5.81)
Retic Count, Absolute: 28.1 10*3/uL (ref 19.0–186.0)
Retic Ct Pct: 1 % (ref 0.4–3.1)

## 2019-01-08 LAB — GLUCOSE, CAPILLARY
Glucose-Capillary: 101 mg/dL — ABNORMAL HIGH (ref 70–99)
Glucose-Capillary: 111 mg/dL — ABNORMAL HIGH (ref 70–99)
Glucose-Capillary: 117 mg/dL — ABNORMAL HIGH (ref 70–99)
Glucose-Capillary: 118 mg/dL — ABNORMAL HIGH (ref 70–99)
Glucose-Capillary: 119 mg/dL — ABNORMAL HIGH (ref 70–99)
Glucose-Capillary: 128 mg/dL — ABNORMAL HIGH (ref 70–99)

## 2019-01-08 LAB — CULTURE, BLOOD (ROUTINE X 2): Special Requests: ADEQUATE

## 2019-01-08 LAB — VITAMIN B12: Vitamin B-12: 606 pg/mL (ref 180–914)

## 2019-01-08 LAB — MAGNESIUM: Magnesium: 2.1 mg/dL (ref 1.7–2.4)

## 2019-01-08 LAB — C-REACTIVE PROTEIN: CRP: 9.8 mg/dL — ABNORMAL HIGH (ref <1.0)

## 2019-01-08 LAB — PHOSPHORUS: Phosphorus: 1.4 mg/dL — ABNORMAL LOW (ref 2.5–4.6)

## 2019-01-08 LAB — FERRITIN: Ferritin: 1679 ng/mL — ABNORMAL HIGH (ref 24–336)

## 2019-01-08 LAB — D-DIMER, QUANTITATIVE: D-Dimer, Quant: 1.45 ug/mL-FEU — ABNORMAL HIGH (ref 0.00–0.50)

## 2019-01-08 MED ORDER — SODIUM CHLORIDE 0.9 % IV SOLN
3.0000 g | Freq: Four times a day (QID) | INTRAVENOUS | Status: DC
  Administered 2019-01-08 – 2019-01-11 (×11): 3 g via INTRAVENOUS
  Filled 2019-01-08 (×6): qty 3
  Filled 2019-01-08: qty 8
  Filled 2019-01-08: qty 3
  Filled 2019-01-08 (×2): qty 8
  Filled 2019-01-08: qty 3
  Filled 2019-01-08: qty 8
  Filled 2019-01-08 (×2): qty 3

## 2019-01-08 MED ORDER — MORPHINE SULFATE (PF) 2 MG/ML IV SOLN
2.0000 mg | INTRAVENOUS | Status: DC | PRN
  Administered 2019-01-08 – 2019-01-09 (×4): 2 mg via INTRAVENOUS
  Filled 2019-01-08 (×4): qty 1

## 2019-01-08 MED ORDER — SODIUM PHOSPHATES 45 MMOLE/15ML IV SOLN
30.0000 mmol | Freq: Once | INTRAVENOUS | Status: AC
  Administered 2019-01-08: 30 mmol via INTRAVENOUS
  Filled 2019-01-08: qty 10

## 2019-01-08 NOTE — Progress Notes (Signed)
PROGRESS NOTE    Logan GoslingWorley Mccreadie   ZOX:[id number]  DOB: 07/29/1929  DOA: 01/05/2019 PCP: Patient, No Pcp Per   Brief Narrative:  Logan Blackwell is a 85 y.o. male who resides in a SNF with history of AICD placement, A. fib, DVT hypothyroidism, chronic back pain, CAD,  H/o of hemarthrosis admitted from July to Aug for septic shock and subsequently developed cognitive impairment, received a PEG tube (in Aug). He recently diagnosed with Covid 19 infection and because of this he was transferred to a SNF in [place] [facility] [place]. He was found to have blood-tinged discharge from his wounds on  lower extremity and was transferred to [facility].  Patient is not able to contribute anything to the history.     CXR in ED> Bibasilar atelectasis or infiltrates. Possible small left effusion versus pleural thickening.   Subjective: He has no complaints today.     Assessment & Plan:   Principal Problem:   Severe Sepsis with tachycardia, leukocytosis, lactic acidosis and elevated procalcitonin - suspected to be due to cellulitis and also COVID 19 - on exam, noted to have extensive erythema and edema of his left arm- CT of this arm suggestive of cellulitis and myofasciitis - Venous duplex was not an adequate study due to pain but no DVT noted - 1 set of blood cultures noted to be + with gram neg rods-  BCID > Acinetobacter baumanii  - has numerous skin tears and wounds which could have lead to the cellulitis and subsequent bacteremia - I have asked for an ID opinion - continue antibiotics per ID (see below antibiotic section)  Active Problems:     COVID-19 virus infection - asymptomatic - initial CXR suggestive of lung infiltrates but repeat is more consistent with atelectasis - following inflammatory markers  Hypokalemia, Hypophosphatemia - replacing K via PEG tube today- no diarrhea noted per nursing staff     A-fib  - not on anticoagulation at baseline -  cont Metoprolol  AICD (automatic cardioverter/defibrillator) present  Chronic diastolic CHF- dehydration - on Lasix as outpt which is currently on hold due to dehydration (hyponatremia, elevated BUN/Cr ratio and dehydration noted on exam when he first arrived)  PEG (percutaneous endoscopic gastrostomy) status  - tube feeds resumed along with free water  Acute encephalopathy superimposed on baseline cognitive issues - likely due to acute illness- appears to be improving- the patient is more oriented today  Hypothyroid - cont Synthroid   Anemia -  anemia panel  GERD? -cont Protonix  Extensive wounds and skin tears  - I have asked for wound care to assist with managing these-   Rash on scalp - recommend derm consult as outpt  Bed bound with PEG and Cognitive impairment - since last admission in July/Aug for septic shock  Code Status - DNR per sons  Time spent in minutes: 35 min-   DVT prophylaxis:  Heparin Code Status: Full code Family Communication: Fernand Parkinsoger Wolpert Disposition Plan: cont to follow for clinical improvement, will return to SNF when improved Consultants:   none Procedures:  2 D ECHO 1. Left ventricular ejection fraction, by visual estimation, is 50 to 55%. The left ventricle has normal function. There is severely increased left ventricular hypertrophy.  2. Elevated left atrial pressure.  3. Indeterminate diastolic filling due to E-A fusion.  4. Poor acoustic windows limit study.  5. Global right ventricle has normal systolic function.The right ventricular size is normal. No increase in right ventricular  wall thickness.  6. Left atrial size was normal.  7. Right atrial size was normal.  8. The mitral valve is grossly normal. Mild mitral valve regurgitation.  9. The tricuspid valve is normal in structure. Tricuspid valve regurgitation is mild. 10. The aortic valve is tricuspid. Aortic valve regurgitation is mild. Mild aortic valve stenosis. 11. The  pulmonic valve was grossly normal. Pulmonic valve regurgitation is mild. 12. Mildly elevated pulmonary artery systolic pressure.  Antimicrobials:  Anti-infectives (From admission, onward)   Start     Dose/Rate Route Frequency Ordered Stop   01/06/19 2200  vancomycin (VANCOCIN) 1,500 mg in sodium chloride 0.9 % 500 mL IVPB     1,500 mg 250 mL/hr over 120 Minutes Intravenous Every 24 hours 01/06/19 1055     01/06/19 1800  imipenem-cilastatin (PRIMAXIN) 500 mg in sodium chloride 0.9 % 100 mL IVPB     500 mg 200 mL/hr over 30 Minutes Intravenous Every 6 hours 01/06/19 1708     01/06/19 1000  ceFEPIme (MAXIPIME) 2 g in sodium chloride 0.9 % 100 mL IVPB  Status:  Discontinued     2 g 200 mL/hr over 30 Minutes Intravenous Every 8 hours 01/06/19 0558 01/06/19 1701   01/06/19 0115  vancomycin (VANCOCIN) 1,250 mg in sodium chloride 0.9 % 250 mL IVPB     1,250 mg 166.7 mL/hr over 90 Minutes Intravenous  Once 01/06/19 0045 01/06/19 0327   01/06/19 0045  ceFEPIme (MAXIPIME) 2 g in sodium chloride 0.9 % 100 mL IVPB     2 g 200 mL/hr over 30 Minutes Intravenous NOW 01/06/19 0043 01/06/19 0152       Objective: Vitals:   01/08/19 0500 01/08/19 0600 01/08/19 0800 01/08/19 0830  BP:   (!) 170/64 (!) 146/53  Pulse: 70 66 61 69  Resp: 18 (!) 23 (!) 25 (!) 26  Temp:   97.9 F (36.6 C)   TempSrc:   Axillary   SpO2: (!) 89% 93% 93% 93%  Weight: 90.1 kg     Height:        Intake/Output Summary (Last 24 hours) at 01/08/2019 1117 Last data filed at 01/08/2019 1021 Gross per 24 hour  Intake 2143.16 ml  Output 1300 ml  Net 843.16 ml   Filed Weights   01/06/19 0652 01/06/19 0800 01/08/19 0500  Weight: 87.7 kg 87.1 kg 90.1 kg    Examination: General exam: Appears comfortable  HEENT: PERRLA, oral mucosa moist, no sclera icterus or thrush Respiratory system: Clear to auscultation. Respiratory effort normal. Cardiovascular system: S1 & S2 heard, RRR.   Gastrointestinal system: Abdomen soft,  non-tender, nondistended. Normal bowel sounds. Central nervous system: Alert and oriented . No focal neurological deficits. Extremities: No cyanosis, clubbing - mild edema of legs noted- left arm erythematous and swollen   Skin: numerous skin tears and pressure ulcers-  ulcrs     Data Reviewed: I have personally reviewed following labs and imaging studies  CBC: Recent Labs  Lab 01/05/19 2334 01/06/19 0648 01/07/19 0210 01/08/19 0210  WBC 14.5* 13.1* 8.5 5.6  NEUTROABS 12.7* 11.3* 7.3 4.0  HGB 11.1* 10.4* 9.5* 8.9*  HCT 34.9* 32.4* 30.7* 29.0*  MCV 95.9 95.9 98.4 98.0  PLT 265 227 261 342   Basic Metabolic Panel: Recent Labs  Lab 01/05/19 2334 01/06/19 0648 01/07/19 0210 01/08/19 0210  NA 134* 134* 140 144  K 3.9 3.6 2.9* 4.3  CL 98 100 106 112*  CO2 GLUCOSE 109* 105* 131*  104*  BUN 46* 47* 45* 39*  CREATININE 0.95 0.87 0.68 0.59*  CALCIUM 7.9* 7.9* 7.7* 7.8*  MG  --   --  2.0 2.1  PHOS  --   --  2.2* 1.4*   GFR: Estimated Creatinine Clearance: 69.7 mL/min (A) (by C-G formula based on SCr of 0.59 mg/dL (L)). Liver Function Tests: Recent Labs  Lab 01/05/19 2334 01/06/19 0648 01/07/19 0210 01/08/19 0210  AST 43* 42* 42* 35  ALT 24 23 23 25   ALKPHOS 49 45 44 45  BILITOT 1.0 0.8 0.7 0.7  PROT 6.2* 5.8* 5.6* 5.7*  ALBUMIN 2.3* 2.1* 2.0* 2.0*   No results for input(s): LIPASE, AMYLASE in the last 168 hours. No results for input(s): AMMONIA in the last 168 hours. Coagulation Profile: Recent Labs  Lab 01/05/19 2334  INR 1.1   Cardiac Enzymes: No results for input(s): CKTOTAL, CKMB, CKMBINDEX, TROPONINI in the last 168 hours. BNP (last 3 results) No results for input(s): PROBNP in the last 8760 hours. HbA1C: No results for input(s): HGBA1C in the last 72 hours. CBG: Recent Labs  Lab 01/07/19 1709 01/07/19 2001 01/08/19 0010 01/08/19 0525 01/08/19 0824  GLUCAP 124* 139* 117* 118* 119*   Lipid Profile: No results for input(s): CHOL,  HDL, LDLCALC, TRIG, CHOLHDL, LDLDIRECT in the last 72 hours. Thyroid Function Tests: No results for input(s): TSH, T4TOTAL, FREET4, T3FREE, THYROIDAB in the last 72 hours. Anemia Panel: Recent Labs    01/07/19 0210 01/07/19 1353 01/08/19 0210  VITAMINB12  --   --  606  FOLATE  --   --  16.2  FERRITIN 2,061*  --  1,679*  TIBC  --  125*  --   IRON  --  58  --   RETICCTPCT  --   --  1.0   Urine analysis:    Component Value Date/Time   COLORURINE YELLOW 01/05/2019 2334   APPEARANCEUR HAZY (A) 01/05/2019 2334   LABSPEC 1.015 01/05/2019 2334   PHURINE 5.0 01/05/2019 2334   GLUCOSEU NEGATIVE 01/05/2019 2334   HGBUR MODERATE (A) 01/05/2019 2334   [place] NEGATIVE 01/05/2019 [place] 01/05/2019 2334   PROTEINUR NEGATIVE 01/05/2019 2334   NITRITE NEGATIVE 01/05/2019 2334   LEUKOCYTESUR NEGATIVE 01/05/2019 2334   Sepsis Labs: @LABRCNTIP (procalcitonin:4,lacticidven:4) ) Recent Results (from the past 240 hour(s))  Blood Culture (routine x 2)     Status: Abnormal   Collection Time: 01/05/19 11:34 PM   Specimen: BLOOD  Result Value Ref Range Status   Specimen Description   Final    BLOOD LEFT ANTECUBITAL Performed at [facility], [place] [address] [place], [place] [address]    Special Requests   Final    BOTTLES DRAWN AEROBIC AND ANAEROBIC Blood Culture adequate volume Performed at [place] [address] [place], [place] [address]    Culture  Setup Time   Final    GRAM NEGATIVE RODS AEROBIC BOTTLE ONLY CRITICAL RESULT CALLED TO, READ BACK BY AND VERIFIED WITH: Melodye Ped PharmD 15:50 01/06/19 ([email or web address]) Performed at [facility], [place] [address], [place], [place] [address]    Culture ACINETOBACTER BAUMANNII (A)  Final   Report Status 01/08/2019 FINAL  Final   Organism ID, Bacteria ACINETOBACTER BAUMANNII  Final      Susceptibility   Acinetobacter baumannii - MIC*    CEFTAZIDIME 4 SENSITIVE Sensitive      CEFTRIAXONE 16 INTERMEDIATE Intermediate     CIPROFLOXACIN <=0.25 SENSITIVE Sensitive  GENTAMICIN <=1 SENSITIVE Sensitive     IMIPENEM <=0.25 SENSITIVE Sensitive     PIP/TAZO <=4 SENSITIVE Sensitive     TRIMETH/SULFA <=20 SENSITIVE Sensitive     CEFEPIME 2 SENSITIVE Sensitive     AMPICILLIN/SULBACTAM <=2 SENSITIVE Sensitive     * ACINETOBACTER BAUMANNII  Urine culture     Status: None   Collection Time: 01/05/19 11:34 PM   Specimen: Urine, Catheterized  Result Value Ref Range Status   Specimen Description   Final    URINE, CATHETERIZED Performed at [facility], [address], [place], [place] [address]    Special Requests   Final    NONE Performed at [facility], [address] [place], [place] [address]    Culture   Final    NO GROWTH Performed at [facility], [address] [place], [place] [address]    Report Status 01/06/2019 FINAL  Final  Blood Culture ID Panel (Reflexed)     Status: Abnormal   Collection Time: 01/05/19 11:34 PM  Result Value Ref Range Status   Enterococcus species NOT DETECTED NOT DETECTED Final   Listeria monocytogenes NOT DETECTED NOT DETECTED Final   Staphylococcus species NOT DETECTED NOT DETECTED Final   Staphylococcus aureus (BCID) NOT DETECTED NOT DETECTED Final   Streptococcus species NOT DETECTED NOT DETECTED Final   Streptococcus agalactiae NOT DETECTED NOT DETECTED Final   Streptococcus pneumoniae NOT DETECTED NOT DETECTED Final   Streptococcus pyogenes NOT DETECTED NOT DETECTED Final   Acinetobacter baumannii DETECTED (A) NOT DETECTED Final    Comment: CRITICAL RESULT CALLED TO, READ BACK BY AND VERIFIED WITH: Earlean Shawl PharmD 15:50 01/06/19 ([email or web address])    Enterobacteriaceae species NOT DETECTED NOT DETECTED Final   Enterobacter cloacae complex NOT DETECTED NOT DETECTED Final   Escherichia coli NOT DETECTED NOT DETECTED Final   Klebsiella oxytoca NOT DETECTED NOT DETECTED Final    Klebsiella pneumoniae NOT DETECTED NOT DETECTED Final   Proteus species NOT DETECTED NOT DETECTED Final   Serratia marcescens NOT DETECTED NOT DETECTED Final   Carbapenem resistance NOT DETECTED NOT DETECTED Final   Haemophilus influenzae NOT DETECTED NOT DETECTED Final   Neisseria meningitidis NOT DETECTED NOT DETECTED Final   Pseudomonas aeruginosa NOT DETECTED NOT DETECTED Final   Candida albicans NOT DETECTED NOT DETECTED Final   Candida glabrata NOT DETECTED NOT DETECTED Final   Candida krusei NOT DETECTED NOT DETECTED Final   Candida parapsilosis NOT DETECTED NOT DETECTED Final   Candida tropicalis NOT DETECTED NOT DETECTED Final    Comment: Performed at [facility], [address] [place], [place] [address]  MRSA PCR Screening     Status: None   Collection Time: 01/06/19  8:20 AM   Specimen: Nasal Mucosa; Nasopharyngeal  Result Value Ref Range Status   MRSA by PCR NEGATIVE NEGATIVE Final    Comment:        The GeneXpert MRSA Assay (FDA approved for NASAL specimens only), is one component of a comprehensive MRSA colonization surveillance program. It is not intended to diagnose MRSA infection nor to guide or monitor treatment for MRSA infections. Performed at [facility], [address], [place], [place] [address]   Culture, blood (routine x 2)     Status: None (Preliminary result)   Collection Time: 01/06/19  5:34 PM   Specimen: Right Antecubital; Blood  Result Value Ref Range Status   Specimen Description   Final    RIGHT ANTECUBITAL Performed  at [facility], [address], [place], [place] [address]    Special Requests   Final    BOTTLES DRAWN AEROBIC ONLY Blood Culture adequate volume Performed at [facility], [address] [place], [place] [address]    Culture   Final    NO GROWTH 2 DAYS Performed at [facility], [address] [place], [place] [address]    Report Status PENDING   Incomplete  Culture, blood (routine x 2)     Status: None (Preliminary result)   Collection Time: 01/06/19  5:44 PM   Specimen: Right Antecubital; Blood  Result Value Ref Range Status   Specimen Description   Final    RIGHT ANTECUBITAL Performed at [facility], [address] [place], [place] [address]    Special Requests   Final    BOTTLES DRAWN AEROBIC AND ANAEROBIC Blood Culture adequate volume Performed at [facility], [address], [place], [place] [address]    Culture   Final    NO GROWTH 2 DAYS Performed at [facility], [address] [place], [place] [address]    Report Status PENDING  Incomplete  Respiratory Panel by PCR     Status: None   Collection Time: 01/07/19 11:30 AM   Specimen: Nasopharyngeal Swab; Respiratory  Result Value Ref Range Status   Adenovirus NOT DETECTED NOT DETECTED Final   Coronavirus 229E NOT DETECTED NOT DETECTED Final    Comment: (NOTE) The Coronavirus on the Respiratory Panel, DOES NOT test for the novel  Coronavirus (2019 nCoV)    Coronavirus HKU1 NOT DETECTED NOT DETECTED Final   Coronavirus NL63 NOT DETECTED NOT DETECTED Final   Coronavirus OC43 NOT DETECTED NOT DETECTED Final   Metapneumovirus NOT DETECTED NOT DETECTED Final   Rhinovirus / Enterovirus NOT DETECTED NOT DETECTED Final   Influenza A NOT DETECTED NOT DETECTED Final   Influenza B NOT DETECTED NOT DETECTED Final   Parainfluenza Virus 1 NOT DETECTED NOT DETECTED Final   Parainfluenza Virus 2 NOT DETECTED NOT DETECTED Final   Parainfluenza Virus 3 NOT DETECTED NOT DETECTED Final   Parainfluenza Virus 4 NOT DETECTED NOT DETECTED Final   Respiratory Syncytial Virus NOT DETECTED NOT DETECTED Final   Bordetella pertussis NOT DETECTED NOT DETECTED Final   Chlamydophila pneumoniae NOT DETECTED NOT DETECTED Final   Mycoplasma pneumoniae NOT DETECTED NOT DETECTED Final    Comment: Performed at [facility], [address],  [place], [place] [address]         Radiology Studies: Dg Chest Port 1 View  Result Date: 01/07/2019 CLINICAL DATA:  Septic, weakness. EXAM: PORTABLE CHEST 1 VIEW COMPARISON:  Chest x-ray dated 01/06/2019. FINDINGS: Stable cardiomegaly. Overall cardiomediastinal silhouette appears stable in size and configuration. LEFT chest wall pacemaker/ICD apparatus appears stable. Probable mild bibasilar atelectasis and/or small pleural effusions. Lungs otherwise clear. No pneumothorax seen. Aortic atherosclerosis. IMPRESSION: 1. Stable cardiomegaly. 2. Probable mild bibasilar atelectasis and/or small pleural effusions. 3. Aortic atherosclerosis. 4. No new lung findings. Electronically Signed   By: Bary Richard M.D.   On: 01/07/2019 11:18   Vas [place] Upper Extremity Venous Duplex  Result Date: 01/08/2019 UPPER VENOUS STUDY  Indications: Pain, Swelling, and cellulitis and myofascitis of the left upper extremity Limitations: Poor ultrasound/tissue interface, body habitus and patient in significant pain. Restricted mobility. Comparison Study: No prior study. Performing Technologist: Gertie Fey MHA, RDMS, RVT, RDCS  Examination Guidelines: A complete evaluation includes  B-mode imaging, spectral Doppler, color Doppler, and power Doppler as needed of all accessible portions of each vessel. Bilateral testing is considered an integral part of a complete examination. Limited examinations for reoccurring indications may be performed as noted.  Left Findings: +----------+------------+---------+-----------+----------+-------+ LEFT      CompressiblePhasicitySpontaneousPropertiesSummary +----------+------------+---------+-----------+----------+-------+ IJV           Full       Yes       Yes                      +----------+------------+---------+-----------+----------+-------+ Subclavian               Yes       Yes                      +----------+------------+---------+-----------+----------+-------+  Axillary                 Yes       Yes                      +----------+------------+---------+-----------+----------+-------+ Brachial                 Yes       Yes                      +----------+------------+---------+-----------+----------+-------+ Radial                   Yes       Yes                      +----------+------------+---------+-----------+----------+-------+ Ulnar                    Yes       Yes                      +----------+------------+---------+-----------+----------+-------+ Cephalic      Full                                          +----------+------------+---------+-----------+----------+-------+ Basilic       Full                                          +----------+------------+---------+-----------+----------+-------+  Summary:  Left: Many compression maneuvers not performed secondary to pain. No evidence of acute DVT involving the left upper extremity by color and pulsed wave Doppler.  *See table(s) above for measurements and observations.    Preliminary       Scheduled Meds: . aspirin  81 mg Oral Daily  . atorvastatin  40 mg Oral QHS  . chlorhexidine  15 mL Mouth Rinse BID  . Chlorhexidine Gluconate Cloth  6 each Topical Daily  . feeding supplement (PRO-STAT SUGAR FREE 64)  30 mL Per Tube TID  . ferrous sulfate  300 mg Oral Daily  . folic acid  1 mg Oral Daily  . free water  100 mL Per Tube Q6H  . Gerhardt's butt cream   Topical TID  . heparin  5,000 Units Subcutaneous Q8H  . levothyroxine  175 mcg Oral Q0600  . mouth rinse  15 mL Mouth Rinse q12n4p  . metoprolol tartrate  25 mg Oral BID  . multivitamin  15 mL Oral Daily  . pantoprazole sodium  40 mg Oral Daily  . sertraline  50 mg Oral Daily  . vitamin B-12  1,000 mcg Oral Daily   Continuous Infusions: . feeding supplement (OSMOLITE 1.5 CAL) 1,000 mL (01/08/19 1000)  . imipenem-cilastatin Stopped (01/08/19 1051)  . vancomycin Stopped (01/07/19 2321)     LOS:  2 days      Calvert Cantor, MD [facility] Pager: [email or web address] Password [email or web address] 01/08/2019, 11:17 AM

## 2019-01-08 NOTE — Progress Notes (Signed)
Pharmacy Antibiotic Note  Logan Blackwell is a 85 y.o. male admitted on 01/05/2019 with bleeding bilateral LEs. Dementia. Doxy from 11/22 for L elbow cellulitis. Patient met sepsis criteria. Pharmacy has been consulted for Vancomycin & Cefepime dosing.  12/2:  Sensitivities to blood culture resulted and antibiotics adjusted as per plan below:  Plan: - Unasyn 3gm IV q6h - D/C Vanc, Primaxin  Height: '5\' 7"'  (170.2 cm) Weight: 198 lb 10.2 oz (90.1 kg) IBW/kg (Calculated) : 66.1  Temp (24hrs), Avg:97.8 F (36.6 C), Min:97.6 F (36.4 C), Max:98 F (36.7 C)  Recent Labs  Lab 01/05/19 2334 01/06/19 0112 01/06/19 0648 01/06/19 0906 01/07/19 0210 01/08/19 0210  WBC 14.5*  --  13.1*  --  8.5 5.6  CREATININE 0.95  --  0.87  --  0.68 0.59*  LATICACIDVEN 2.3* 2.0* 1.6 1.8  --   --     Estimated Creatinine Clearance: 69.7 mL/min (A) (by C-G formula based on SCr of 0.59 mg/dL (L)).    No Known Allergies  Antimicrobials this admission: 11/29 Vanc >> 12/2 11/29 Cefepime >> 11/30 11/30 Primaxin >> 12/2 12/2 Unasyn >>  Dose adjustments this admission:  Microbiology results: 11/29 BCx: Acinetobacter (pan sensistive except for CTX) 11/29 MRSA PCR: sent  Thank you for allowing pharmacy to be a part of this patient's care.  Everette Rank PharmD 01/08/2019 4:29 PM

## 2019-01-08 NOTE — Progress Notes (Signed)
ID PROGRESS NOTE  85yo M with covid, multiple medical problems, failure to thrive, worsening tissue injury/SSTI admitted for evaluation of cellulitis. Found to have acinetobacter bacteremia.   Susceptibilities = sensitive to amp/sub  Recs: GNR bacteremia Change him to 14d course of amp/sub Discontinue carbapenem and vancomycin  covid-19 = inflammatory markers trending down. Continue with supportive care

## 2019-01-08 NOTE — Progress Notes (Signed)
Left upper extremity venous duplex completed. Refer to "CV Proc" under chart review to view preliminary results.  01/08/2019 10:40 AM Kelby Aline., MHA, RVT, RDCS, RDMS

## 2019-01-09 ENCOUNTER — Inpatient Hospital Stay: Payer: Self-pay

## 2019-01-09 ENCOUNTER — Encounter (HOSPITAL_COMMUNITY): Payer: Self-pay

## 2019-01-09 LAB — CBC WITH DIFFERENTIAL/PLATELET
Abs Immature Granulocytes: 0.36 10*3/uL — ABNORMAL HIGH (ref 0.00–0.07)
Basophils Absolute: 0.1 10*3/uL (ref 0.0–0.1)
Basophils Relative: 2 %
Eosinophils Absolute: 0.2 10*3/uL (ref 0.0–0.5)
Eosinophils Relative: 4 %
HCT: 28.2 % — ABNORMAL LOW (ref 39.0–52.0)
Hemoglobin: 8.6 g/dL — ABNORMAL LOW (ref 13.0–17.0)
Immature Granulocytes: 8 %
Lymphocytes Relative: 24 %
Lymphs Abs: 1 10*3/uL (ref 0.7–4.0)
MCH: 30 pg (ref 26.0–34.0)
MCHC: 30.5 g/dL (ref 30.0–36.0)
MCV: 98.3 fL (ref 80.0–100.0)
Monocytes Absolute: 0.5 10*3/uL (ref 0.1–1.0)
Monocytes Relative: 12 %
Neutro Abs: 2.2 10*3/uL (ref 1.7–7.7)
Neutrophils Relative %: 50 %
Platelets: 268 10*3/uL (ref 150–400)
RBC: 2.87 MIL/uL — ABNORMAL LOW (ref 4.22–5.81)
RDW: 16.7 % — ABNORMAL HIGH (ref 11.5–15.5)
WBC: 4.3 10*3/uL (ref 4.0–10.5)
nRBC: 0 % (ref 0.0–0.2)

## 2019-01-09 LAB — COMPREHENSIVE METABOLIC PANEL
ALT: 24 U/L (ref 0–44)
AST: 32 U/L (ref 15–41)
Albumin: 1.9 g/dL — ABNORMAL LOW (ref 3.5–5.0)
Alkaline Phosphatase: 40 U/L (ref 38–126)
Anion gap: 6 (ref 5–15)
BUN: 36 mg/dL — ABNORMAL HIGH (ref 8–23)
CO2: 26 mmol/L (ref 22–32)
Calcium: 7.3 mg/dL — ABNORMAL LOW (ref 8.9–10.3)
Chloride: 110 mmol/L (ref 98–111)
Creatinine, Ser: 0.69 mg/dL (ref 0.61–1.24)
GFR calc Af Amer: >60 mL/min (ref >60)
GFR calc non Af Amer: >60 mL/min (ref >60)
Glucose, Bld: 128 mg/dL — ABNORMAL HIGH (ref 70–99)
Potassium: 4.1 mmol/L (ref 3.5–5.1)
Sodium: 142 mmol/L (ref 135–145)
Total Bilirubin: 0.9 mg/dL (ref 0.3–1.2)
Total Protein: 5.1 g/dL — ABNORMAL LOW (ref 6.5–8.1)

## 2019-01-09 LAB — PHOSPHORUS: Phosphorus: 2.1 mg/dL — ABNORMAL LOW (ref 2.5–4.6)

## 2019-01-09 LAB — GLUCOSE, CAPILLARY
Glucose-Capillary: 123 mg/dL — ABNORMAL HIGH (ref 70–99)
Glucose-Capillary: 117 mg/dL — ABNORMAL HIGH (ref 70–99)
Glucose-Capillary: 104 mg/dL — ABNORMAL HIGH (ref 70–99)
Glucose-Capillary: 127 mg/dL — ABNORMAL HIGH (ref 70–99)

## 2019-01-09 LAB — C-REACTIVE PROTEIN: CRP: 4.9 mg/dL — ABNORMAL HIGH (ref <1.0)

## 2019-01-09 LAB — D-DIMER, QUANTITATIVE: D-Dimer, Quant: 1.35 ug/mL-FEU — ABNORMAL HIGH (ref 0.00–0.50)

## 2019-01-09 LAB — MAGNESIUM: Magnesium: 2.2 mg/dL (ref 1.7–2.4)

## 2019-01-09 LAB — FERRITIN: Ferritin: 1708 ng/mL — ABNORMAL HIGH (ref 24–336)

## 2019-01-09 NOTE — TOC Progression Note (Signed)
Transition of Care [facility]) - Progression Note    Patient Details  Name: Milfred Krammes MRN: [id number] Date of Birth: [birth year]  [facility] CM/SW [place], [place] Phone Number:  01/09/2019, 4:44 PM  Clinical Narrative:   Preparing for pt's DC, updated SNF re: pt's need for IV abx at DC and updated FL2. As this is new skilled need, requested authorization from [facility].     Expected Discharge Plan: Skilled Nursing Facility Barriers to Discharge: Other (comment)(medical stability)  Expected Discharge Plan and Services Expected Discharge Plan: [place] In-house Referral: Clinical Social Work     Living arrangements for the past 2 months: [place]                                       Social Determinants of Health (SDOH) Interventions    Readmission Risk Interventions No flowsheet data found.

## 2019-01-09 NOTE — Progress Notes (Addendum)
PROGRESS NOTE    Logan Blackwell   WUJ:[id number]  DOB: 08-Jul-1929  DOA: 01/05/2019 PCP: Patient, No Pcp Per   Brief Narrative:  Logan Blackwell is a 85 y.o. male who resides in a SNF with history of AICD placement, A. fib, DVT hypothyroidism, chronic back pain, CAD,  H/o of hemarthrosis admitted from July to Aug for septic shock and subsequently developed cognitive impairment, received a PEG tube (in Aug). He recently diagnosed with Covid 19 infection and because of this he was transferred to a SNF in [place] from [facility] rehab in [place]. He was found to have blood-tinged discharge from his wounds on  lower extremity and was transferred to [facility].  Patient is not able to contribute anything to the history.     CXR in ED> Bibasilar atelectasis or infiltrates. Possible small left effusion versus pleural thickening.   Subjective: He has no complaints today.     Assessment & Plan:   Principal Problem:   Severe Sepsis with tachycardia, leukocytosis, lactic acidosis and elevated procalcitonin - suspected to be due to cellulitis and also COVID 19 - on exam, noted to have extensive erythema and edema of his left arm- CT of this arm suggestive of cellulitis and myofasciitis - Venous duplex was not an adequate study due to pain but no DVT noted - 1 set of blood cultures noted to be + with gram neg rods-  BCID > Acinetobacter baumanii  - has numerous skin tears and wounds which could have lead to the cellulitis and subsequent bacteremia - I have asked for an ID opinion - At this time, recommendations are to continue Anitbitoics (Unasyn) for a total of 14 days. It was started on 01/08/19. - discussed PICC line with son, Fredrik Cove. He is in agreement with this.   Active Problems:     COVID-19 virus infection - asymptomatic- According to his sone, he tested positive on 11/29 - initial CXR suggestive of lung infiltrates but repeat is more consistent with atelectasis -  following inflammatory markers  Hypokalemia, Hypophosphatemia - replaced     A-fib  - not on anticoagulation at baseline - cont Metoprolol  AICD (automatic cardioverter/defibrillator) present  Chronic diastolic CHF- dehydration - on Lasix as outpt which is currently on hold due to dehydration (hyponatremia, elevated BUN/Cr ratio and dehydration noted on exam when he first arrived)  PEG (percutaneous endoscopic gastrostomy) status  - tube feeds resumed along with free water  Acute encephalopathy superimposed on baseline cognitive issues - likely due to acute illness- appears to be improving-  Hypothyroid - cont Synthroid   Anemia -  anemia panel  GERD? -cont Protonix  Extensive wounds and skin tears  - I have asked for wound care to assist with managing these-   Rash on scalp - recommend derm consult as outpt  Bed bound with PEG and Cognitive impairment - since last admission in July/Aug for septic shock  Code Status - DNR per sons  Time spent in minutes: 35 min-   DVT prophylaxis:  Heparin Code Status: Full code Family Communication: Fernand Parkins Disposition Plan: cont to follow for clinical improvement, will return to SNF when improved Consultants:   none Procedures:  2 D ECHO 1. Left ventricular ejection fraction, by visual estimation, is 50 to 55%. The left ventricle has normal function. There is severely increased left ventricular hypertrophy.  2. Elevated left atrial pressure.  3. Indeterminate diastolic filling due to E-A fusion.  4. Poor acoustic windows limit study.  5. Global right ventricle has normal systolic function.The right ventricular size is normal. No increase in right ventricular wall thickness.  6. Left atrial size was normal.  7. Right atrial size was normal.  8. The mitral valve is grossly normal. Mild mitral valve regurgitation.  9. The tricuspid valve is normal in structure. Tricuspid valve regurgitation is mild. 10. The aortic  valve is tricuspid. Aortic valve regurgitation is mild. Mild aortic valve stenosis. 11. The pulmonic valve was grossly normal. Pulmonic valve regurgitation is mild. 12. Mildly elevated pulmonary artery systolic pressure.  Antimicrobials:  Anti-infectives (From admission, onward)   Start     Dose/Rate Route Frequency Ordered Stop   01/08/19 2200  Ampicillin-Sulbactam (UNASYN) 3 g in sodium chloride 0.9 % 100 mL IVPB     3 g 200 mL/hr over 30 Minutes Intravenous Every 6 hours 01/08/19 1615     01/06/19 2200  vancomycin (VANCOCIN) 1,500 mg in sodium chloride 0.9 % 500 mL IVPB  Status:  Discontinued     1,500 mg 250 mL/hr over 120 Minutes Intravenous Every 24 hours 01/06/19 1055 01/08/19 1613   01/06/19 1800  imipenem-cilastatin (PRIMAXIN) 500 mg in sodium chloride 0.9 % 100 mL IVPB  Status:  Discontinued     500 mg 200 mL/hr over 30 Minutes Intravenous Every 6 hours 01/06/19 1708 01/08/19 1613   01/06/19 1000  ceFEPIme (MAXIPIME) 2 g in sodium chloride 0.9 % 100 mL IVPB  Status:  Discontinued     2 g 200 mL/hr over 30 Minutes Intravenous Every 8 hours 01/06/19 0558 01/06/19 1701   01/06/19 0115  vancomycin (VANCOCIN) 1,250 mg in sodium chloride 0.9 % 250 mL IVPB     1,250 mg 166.7 mL/hr over 90 Minutes Intravenous  Once 01/06/19 0045 01/06/19 0327   01/06/19 0045  ceFEPIme (MAXIPIME) 2 g in sodium chloride 0.9 % 100 mL IVPB     2 g 200 mL/hr over 30 Minutes Intravenous NOW 01/06/19 0043 01/06/19 0152       Objective: Vitals:   01/09/19 0500 01/09/19 0800 01/09/19 0845 01/09/19 0849  BP:  (!) 114/30 (!) 123/53   Pulse:  (!) 59 71 66  Resp:  17  17  Temp:    98.4 F (36.9 C)  TempSrc:    Oral  SpO2:  95%  92%  Weight: 90.8 kg     Height:        Intake/Output Summary (Last 24 hours) at 01/09/2019 1202 Last data filed at 01/09/2019 1000 Gross per 24 hour  Intake 1793.68 ml  Output 600 ml  Net 1193.68 ml   Filed Weights   01/06/19 0800 01/08/19 0500 01/09/19 0500  Weight:  87.1 kg 90.1 kg 90.8 kg    Examination: General exam: Appears comfortable  HEENT: PERRLA, oral mucosa moist, no sclera icterus or thrush Respiratory system: Clear to auscultation. Respiratory effort normal. Cardiovascular system: S1 & S2 heard,  No murmurs  Gastrointestinal system: Abdomen soft, non-tender, nondistended. Normal bowel sounds. PEG tube present. Central nervous system: Alert and oriented to person today-. No focal neurological deficits. Extremities: No cyanosis, clubbing - left arm still erythematous and edematous Skin: numerous rashes and skin tears on body. Extensive rash on scalp.  Psychiatry:  Mood & affect appropriate.    Data Reviewed: I have personally reviewed following labs and imaging studies  CBC: Recent Labs  Lab 01/05/19 2334 01/06/19 0648 01/07/19 0210 01/08/19 0210 01/09/19 0241  WBC 14.5* 13.1* 8.5 5.6 4.3  NEUTROABS 12.7* 11.3* 7.3  4.0 2.2  HGB 11.1* 10.4* 9.5* 8.9* 8.6*  HCT 34.9* 32.4* 30.7* 29.0* 28.2*  MCV 95.9 95.9 98.4 98.0 98.3  PLT 265 227 261 342 268   Basic Metabolic Panel: Recent Labs  Lab 01/05/19 2334 01/06/19 0648 01/07/19 0210 01/08/19 0210 01/09/19 0241  NA 134* 134* 140 144 142  K 3.9 3.6 2.9* 4.3 4.1  CL 98 100 106 112* 110  CO2 GLUCOSE 109* 105* 131* 104* 128*  BUN 46* 47* 45* 39* 36*  CREATININE 0.95 0.87 0.68 0.59* 0.69  CALCIUM 7.9* 7.9* 7.7* 7.8* 7.3*  MG  --   --  2.0 2.1 2.2  PHOS  --   --  2.2* 1.4* 2.1*   GFR: Estimated Creatinine Clearance: 69.9 mL/min (by C-G formula based on SCr of 0.69 mg/dL). Liver Function Tests: Recent Labs  Lab 01/05/19 2334 01/06/19 0648 01/07/19 0210 01/08/19 0210 01/09/19 0241  AST 43* 42* 42* 35 32  ALT ALKPHOS 49 45 44 45 40  BILITOT 1.0 0.8 0.7 0.7 0.9  PROT 6.2* 5.8* 5.6* 5.7* 5.1*  ALBUMIN 2.3* 2.1* 2.0* 2.0* 1.9*   No results for input(s): LIPASE, AMYLASE in the last 168 hours. No results for input(s): AMMONIA in the last 168  hours. Coagulation Profile: Recent Labs  Lab 01/05/19 2334  INR 1.1   Cardiac Enzymes: No results for input(s): CKTOTAL, CKMB, CKMBINDEX, TROPONINI in the last 168 hours. BNP (last 3 results) No results for input(s): PROBNP in the last 8760 hours. HbA1C: No results for input(s): HGBA1C in the last 72 hours. CBG: Recent Labs  Lab 01/08/19 0525 01/08/19 0824 01/08/19 1133 01/08/19 1609 01/08/19 2058  GLUCAP 118* 119* 128* 101* 111*   Lipid Profile: No results for input(s): CHOL, HDL, LDLCALC, TRIG, CHOLHDL, LDLDIRECT in the last 72 hours. Thyroid Function Tests: No results for input(s): TSH, T4TOTAL, FREET4, T3FREE, THYROIDAB in the last 72 hours. Anemia Panel: Recent Labs    01/07/19 1353 01/08/19 0210 01/09/19 0241  VITAMINB12  --  606  --   FOLATE  --  16.2  --   FERRITIN  --  1,679* 1,708*  TIBC 125*  --   --   IRON 58  --   --   RETICCTPCT  --  1.0  --    Urine analysis:    Component Value Date/Time   COLORURINE YELLOW 01/05/2019 2334   APPEARANCEUR HAZY (A) 01/05/2019 2334   LABSPEC 1.015 01/05/2019 2334   PHURINE 5.0 01/05/2019 2334   GLUCOSEU NEGATIVE 01/05/2019 2334   HGBUR MODERATE (A) 01/05/2019 2334   BILIRUBINUR NEGATIVE 01/05/2019 2334   KETONESUR NEGATIVE 01/05/2019 2334   PROTEINUR NEGATIVE 01/05/2019 2334   NITRITE NEGATIVE 01/05/2019 2334   LEUKOCYTESUR NEGATIVE 01/05/2019 2334   Sepsis Labs: (procalcitonin:4,lacticidven:4) ) Recent Results (from the past 240 hour(s))  Blood Culture (routine x 2)     Status: Abnormal   Collection Time: 01/05/19 11:34 PM   Specimen: BLOOD  Result Value Ref Range Status   Specimen Description   Final    BLOOD LEFT ANTECUBITAL Performed at [facility], [address] [place], [place] [address]    Special Requests   Final    BOTTLES DRAWN AEROBIC AND ANAEROBIC Blood Culture adequate volume Performed at [facility], [address], [place], [place]  [address]    Culture  Setup Time   Final    GRAM NEGATIVE RODS AEROBIC  BOTTLE ONLY CRITICAL RESULT CALLED TO, READ BACK BY AND VERIFIED WITH: Earlean Shawl PharmD 15:50 01/06/19 ([email or web address]) Performed at [facility], [address], [place], [place] [address]    Culture ACINETOBACTER BAUMANNII (A)  Final   Report Status 01/08/2019 FINAL  Final   Organism ID, Bacteria ACINETOBACTER BAUMANNII  Final      Susceptibility   Acinetobacter baumannii - MIC*    CEFTAZIDIME 4 SENSITIVE Sensitive     CEFTRIAXONE 16 INTERMEDIATE Intermediate     CIPROFLOXACIN <=0.25 SENSITIVE Sensitive     GENTAMICIN <=1 SENSITIVE Sensitive     IMIPENEM <=0.25 SENSITIVE Sensitive     PIP/TAZO <=4 SENSITIVE Sensitive     TRIMETH/SULFA <=20 SENSITIVE Sensitive     CEFEPIME 2 SENSITIVE Sensitive     AMPICILLIN/SULBACTAM <=2 SENSITIVE Sensitive     * ACINETOBACTER BAUMANNII  Urine culture     Status: None   Collection Time: 01/05/19 11:34 PM   Specimen: Urine, Catheterized  Result Value Ref Range Status   Specimen Description   Final    URINE, CATHETERIZED Performed at [facility], [address], [place], [place] [address]    Special Requests   Final    NONE Performed at [facility], [address] [place], [place] [address]    Culture   Final    NO GROWTH Performed at [facility], [address] [place], [place] [address]    Report Status 01/06/2019 FINAL  Final  Blood Culture ID Panel (Reflexed)     Status: Abnormal   Collection Time: 01/05/19 11:34 PM  Result Value Ref Range Status   Enterococcus species NOT DETECTED NOT DETECTED Final   Listeria monocytogenes NOT DETECTED NOT DETECTED Final   Staphylococcus species NOT DETECTED NOT DETECTED Final   Staphylococcus aureus (BCID) NOT DETECTED NOT DETECTED Final   Streptococcus species NOT DETECTED NOT DETECTED Final   Streptococcus agalactiae NOT DETECTED NOT DETECTED Final   Streptococcus pneumoniae NOT  DETECTED NOT DETECTED Final   Streptococcus pyogenes NOT DETECTED NOT DETECTED Final   Acinetobacter baumannii DETECTED (A) NOT DETECTED Final    Comment: CRITICAL RESULT CALLED TO, READ BACK BY AND VERIFIED WITH: Earlean Shawl PharmD 15:50 01/06/19 ([email or web address])    Enterobacteriaceae species NOT DETECTED NOT DETECTED Final   Enterobacter cloacae complex NOT DETECTED NOT DETECTED Final   Escherichia coli NOT DETECTED NOT DETECTED Final   Klebsiella oxytoca NOT DETECTED NOT DETECTED Final   Klebsiella pneumoniae NOT DETECTED NOT DETECTED Final   Proteus species NOT DETECTED NOT DETECTED Final   Serratia marcescens NOT DETECTED NOT DETECTED Final   Carbapenem resistance NOT DETECTED NOT DETECTED Final   Haemophilus influenzae NOT DETECTED NOT DETECTED Final   Neisseria meningitidis NOT DETECTED NOT DETECTED Final   Pseudomonas aeruginosa NOT DETECTED NOT DETECTED Final   Candida albicans NOT DETECTED NOT DETECTED Final   Candida glabrata NOT DETECTED NOT DETECTED Final   Candida krusei NOT DETECTED NOT DETECTED Final   Candida parapsilosis NOT DETECTED NOT DETECTED Final   Candida tropicalis NOT DETECTED NOT DETECTED Final    Comment: Performed at [facility], [address], [place], [place] [address]  MRSA PCR Screening     Status: None   Collection Time: 01/06/19  8:20 AM   Specimen: Nasal Mucosa; Nasopharyngeal  Result Value Ref Range Status   MRSA by PCR NEGATIVE NEGATIVE Final    Comment:        The GeneXpert  MRSA Assay (FDA approved for NASAL specimens only), is one component of a comprehensive MRSA colonization surveillance program. It is not intended to diagnose MRSA infection nor to guide or monitor treatment for MRSA infections. Performed at [facility], [address], [place], [place] [address]   Culture, blood (routine x 2)     Status: None (Preliminary result)   Collection Time: 01/06/19  5:34 PM   Specimen: Right Antecubital; Blood  Result  Value Ref Range Status   Specimen Description   Final    RIGHT ANTECUBITAL Performed at [facility], [address] [place], [place] [address]    Special Requests   Final    BOTTLES DRAWN AEROBIC ONLY Blood Culture adequate volume Performed at [facility], [address] [place], [place] [address]    Culture   Final    NO GROWTH 3 DAYS Performed at [facility], [address] [place], [place] [address]    Report Status PENDING  Incomplete  Culture, blood (routine x 2)     Status: None (Preliminary result)   Collection Time: 01/06/19  5:44 PM   Specimen: Right Antecubital; Blood  Result Value Ref Range Status   Specimen Description   Final    RIGHT ANTECUBITAL Performed at [facility], [address] [place], [place] [address]    Special Requests   Final    BOTTLES DRAWN AEROBIC AND ANAEROBIC Blood Culture adequate volume Performed at [facility], [address] [place], [place] [address]    Culture   Final    NO GROWTH 3 DAYS Performed at [facility], [address], [place], [place] [address]    Report Status PENDING  Incomplete  Respiratory Panel by PCR     Status: None   Collection Time: 01/07/19 11:30 AM   Specimen: Nasopharyngeal Swab; Respiratory  Result Value Ref Range Status   Adenovirus NOT DETECTED NOT DETECTED Final   Coronavirus 229E NOT DETECTED NOT DETECTED Final    Comment: (NOTE) The Coronavirus on the Respiratory Panel, DOES NOT test for the novel  Coronavirus (2019 nCoV)    Coronavirus HKU1 NOT DETECTED NOT DETECTED Final   Coronavirus NL63 NOT DETECTED NOT DETECTED Final   Coronavirus OC43 NOT DETECTED NOT DETECTED Final   Metapneumovirus NOT DETECTED NOT DETECTED Final   Rhinovirus / Enterovirus NOT DETECTED NOT DETECTED Final   Influenza A NOT DETECTED NOT DETECTED Final   Influenza B NOT DETECTED NOT DETECTED Final   Parainfluenza Virus 1 NOT  DETECTED NOT DETECTED Final   Parainfluenza Virus 2 NOT DETECTED NOT DETECTED Final   Parainfluenza Virus 3 NOT DETECTED NOT DETECTED Final   Parainfluenza Virus 4 NOT DETECTED NOT DETECTED Final   Respiratory Syncytial Virus NOT DETECTED NOT DETECTED Final   Bordetella pertussis NOT DETECTED NOT DETECTED Final   Chlamydophila pneumoniae NOT DETECTED NOT DETECTED Final   Mycoplasma pneumoniae NOT DETECTED NOT DETECTED Final    Comment: Performed at [facility], [address] [place], [place] [address]         Radiology Studies: Vas [place] Upper Extremity Venous Duplex  Result Date: 01/08/2019 UPPER VENOUS STUDY  Indications: Pain, Swelling, and cellulitis and myofascitis of the left upper extremity Limitations: Poor ultrasound/tissue interface, body habitus and patient in significant pain. Restricted mobility. Comparison Study: No prior study. Performing Technologist: Gertie Fey MHA, RDMS, RVT, RDCS  Examination Guidelines: A complete evaluation includes B-mode  imaging, spectral Doppler, color Doppler, and power Doppler as needed of all accessible portions of each vessel. Bilateral testing is considered an integral part of a complete examination. Limited examinations for reoccurring indications may be performed as noted.  Left Findings: +----------+------------+---------+-----------+----------+-------+  LEFT       Compressible Phasicity Spontaneous Properties Summary  +----------+------------+---------+-----------+----------+-------+  IJV            Full        Yes        Yes                         +----------+------------+---------+-----------+----------+-------+  Subclavian                 Yes        Yes                         +----------+------------+---------+-----------+----------+-------+  Axillary                   Yes        Yes                         +----------+------------+---------+-----------+----------+-------+  Brachial                   Yes        Yes                          +----------+------------+---------+-----------+----------+-------+  Radial                     Yes        Yes                         +----------+------------+---------+-----------+----------+-------+  Ulnar                      Yes        Yes                         +----------+------------+---------+-----------+----------+-------+  Cephalic       Full                                               +----------+------------+---------+-----------+----------+-------+  Basilic        Full                                               +----------+------------+---------+-----------+----------+-------+  Summary:  Left: Many compression maneuvers not performed secondary to pain. No evidence of acute DVT involving the left upper extremity by color and pulsed wave Doppler.  *See table(s) above for measurements and observations.  Diagnosing physician: Coral ElseVance Brabham MD Electronically signed by Coral ElseVance Brabham MD on 01/08/2019 at 2:42:40 PM.    Final       Scheduled Meds:  aspirin  81 mg Oral Daily   atorvastatin  40 mg Oral QHS   chlorhexidine  15 mL Mouth Rinse BID   Chlorhexidine Gluconate Cloth  6 each Topical Daily  feeding supplement (PRO-STAT SUGAR FREE 64)  30 mL Per Tube TID   ferrous sulfate  300 mg Oral Daily   folic acid  1 mg Oral Daily   free water  100 mL Per Tube Q6H   Gerhardt's butt cream   Topical TID   heparin  5,000 Units Subcutaneous Q8H   levothyroxine  175 mcg Oral Q0600   mouth rinse  15 mL Mouth Rinse q12n4p   metoprolol tartrate  25 mg Oral BID   multivitamin  15 mL Oral Daily   pantoprazole sodium  40 mg Oral Daily   sertraline  50 mg Oral Daily   vitamin B-12  1,000 mcg Oral Daily   Continuous Infusions:  ampicillin-sulbactam (UNASYN) IV Stopped (01/09/19 0909)   feeding supplement (OSMOLITE 1.5 CAL) 1,000 mL (01/08/19 1000)     LOS: 3 days      Debbe Odea, MD [facility] Pager: [email or web address] Password [email or web address] 01/09/2019, 12:02 PM

## 2019-01-09 NOTE — Evaluation (Signed)
Clinical/Bedside Swallow Evaluation Patient Details  Name: Logan Blackwell MRN: [id number] Date of Birth: 05/17/1929  Today's Date: 01/09/2019 Time: SLP Start Time (ACUTE ONLY): 5397 SLP Stop Time (ACUTE ONLY): 1300 SLP Time Calculation (min) (ACUTE ONLY): 25 min  Past Medical History: History reviewed. No pertinent past medical history. Past Surgical History: The histories are not reviewed yet. Please review them in the "History" navigator section and refresh this [place]. HPI:  85 y.o. male with history of PEG, AICD placement, A. fib, DVT hypothyroidism hypoalbuminemia CAD admitted in July for septic shock at [facility] previous history of hemarthrosis who was recently diagnosed with Covid 19 infection. CXR = Probable mild bibasilar atelectasis and/or small pleural effusions.   Assessment / Plan / Recommendation Clinical Impression  Pt presents with generalized oral motor weakness and poor dentition. Oral care completed with suction. Following oral care, pt accepted trials of ice chips, thin liquid, and puree textures. No oral difficulty or overt s/s aspiration observed following any trial. Recommend resuming puree diet with thin liquids, meds via PEG. SLP will follow to assess diet tolerance and continue education. RN and MD informed of results and recommendations. Safe swallow precautions left to be posted at [facility].    SLP Visit Diagnosis: Dysphagia, unspecified (R13.10)    Aspiration Risk  Mild aspiration risk;Moderate aspiration risk    Diet Recommendation Thin liquid;Dysphagia 1 (Puree)   Liquid Administration via: Cup;Straw Medication Administration: Via alternative means Supervision: Full supervision/cueing for compensatory strategies Compensations: Slow rate;Small sips/bites Postural Changes: Seated upright at 90 degrees    Other  Recommendations Oral Care Recommendations: Oral care QID   Follow up Recommendations 24 hour supervision/assistance      Frequency and Duration min  1 x/week  1 week;2 weeks       Prognosis Prognosis for Safe Diet Advancement: Fair Barriers to Reach Goals: Cognitive deficits      Swallow Study   General Date of Onset: 01/05/19 HPI: 85 y.o. male with history of PEG, AICD placement, A. fib, DVT hypothyroidism hypoalbuminemia CAD admitted in July for septic shock at [facility] previous history of hemarthrosis who was recently diagnosed with Covid 19 infection. CXR = Probable mild bibasilar atelectasis and/or small pleural effusions. Type of Study: Bedside Swallow Evaluation Previous Swallow Assessment: NONE Diet Prior to this Study: PEG tube;NPO Temperature Spikes Noted: No Respiratory Status: Room air History of Recent Intubation: No Behavior/Cognition: Alert;Cooperative;Confused;Requires cueing Oral Cavity Assessment: Dry Oral Care Completed by SLP: Yes Oral Cavity - Dentition: Missing dentition;Poor condition Vision: Impaired for self-feeding Self-Feeding Abilities: Total assist Patient Positioning: Upright in bed Baseline Vocal Quality: Normal Volitional Cough: Weak Volitional Swallow: Unable to elicit    Oral/Motor/Sensory Function Overall Oral Motor/Sensory Function: Generalized oral weakness   Ice Chips Ice chips: Within functional limits Presentation: Spoon   Thin Liquid Thin Liquid: Within functional limits Presentation: Straw    Nectar Thick Nectar Thick Liquid: Not tested   Honey Thick Honey Thick Liquid: Not tested   Puree Puree: Within functional limits Presentation: Spoon   Solid     Solid: Not tested     Enriqueta Shutter, [facility] [place] Pathologist Office: [phone] Pager: [phone]  Shonna Chock 01/09/2019,2:13 PM

## 2019-01-09 NOTE — Progress Notes (Signed)
ID PROGRESS NOTE  Afebrile, still no significant change of edema to left arm.  No new micro data, repeat blood cx ngtd  A/P: acinetobacter baumanii  -unclear source other than he did have some loose stools  - will plan to treat for 14 d with amp/sub using 11/30 as day 1 - he will need picc line to finish out course - plan to do through 12/13  Tramain Gershman B. [place] [facility] [phone]

## 2019-01-09 NOTE — NC FL2 (Signed)
[place] LEVEL OF CARE SCREENING TOOL     IDENTIFICATION  Patient Name: Logan Blackwell Birthdate: July 25, 1929 Sex: male Admission Date (Current Location): 01/05/2019  [place] and [place] Number:  Kathleen Argue [id number] [place] and Address:  [facility],  [address] [place], [place]      Provider Number: [id number]  Attending Physician Name and Address:  Debbe Odea, MD  Relative Name and Phone Number:  [phone] son Edmon Magid    Current Level of Care: Hospital Recommended Level of Care: [place] Prior Approval Number:    Date Approved/Denied:   PASRR Number:    Discharge Plan: SNF    Current Diagnoses: Patient Active Problem List   Diagnosis Date Noted  . Pressure injury of skin 01/07/2019  . A-fib ([place]) 01/07/2019  . AICD (automatic cardioverter/defibrillator) present 01/07/2019  . PEG (percutaneous endoscopic gastrostomy) status ([place]) 01/07/2019  . Sepsis ([place]) 01/06/2019  . COVID-19 virus infection 01/06/2019  . Cellulitis 01/06/2019  . 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 Patient not able to give history.  Would recommend dermatology consultation for differential dx as outpatient; not open or draining so no topical care needed at this  time Detachment of left ear lobe; admitted with this condition Pressure Injury POA: Yes? Pending consultation Measurement: will request nursing staff to measure when topical care provided  Wound bed: LLE; oozing blood; dressings saturated; some darkening at wound border; ruddy wound bed but does not appear infected RLE; scattered small partial thickness ulcers Pink scattered partial thickness skin loss over bilateral buttocks Pink, moist skin tears noted x 2 on patient's back; skin appears fragile Drainage (amount, consistency, odor) see above for the LEs  Periwound: intact at LE wounds, venous stasis dermatitis with hemosiderin staining Dressing procedure/placement/frequency: Will add calcium alginate for hemostatic properties if bleeding continues to be an issue.  If not ok to use xeroform gauze and dry boot compression Silicone foam to the skin tears per the skin care order set Gerhardt's butt cream to the bilateral buttock; consider FC only if condition worsenes LALM in place while in the ICU, will need air mattress for pressure redistribution  PEG tube for supplementation for nutritional needs. No topical care for head lesions; follow up as outpatient with dermatology for differential dx.                   Personal Care Assistance Level of Assistance  Bathing, Feeding, Dressing Bathing Assistance: Maximum assistance Feeding assistance: Independent Dressing Assistance: Maximum assistance     Functional Limitations Info  Sight, Hearing, Speech Sight Info: Adequate Hearing Info: Adequate Speech Info: Adequate    SPECIAL CARE FACTORS FREQUENCY  Speech therapy, PT (By licensed PT)     PT Frequency: 5x       Speech Therapy Frequency: 1x  Contractures Contractures Info: Not present    Additional Factors Info  Code Status, Allergies Code Status Info: DNR Allergies Info: nka           Current Medications (01/09/2019):  This is the current hospital active  medication list Current Facility-Administered Medications  Medication Dose Route Frequency Provider Last Rate Last Dose  . acetaminophen (TYLENOL) tablet 650 mg  650 mg Oral Q6H PRN Eduard ClosKakrakandy, Arshad N, MD   650 mg at 01/07/19 2003   Or  . acetaminophen (TYLENOL) suppository 650 mg  650 mg Rectal Q6H PRN Eduard ClosKakrakandy, Arshad N, MD      . Ampicillin-Sulbactam (UNASYN) 3 g in sodium chloride 0.9 % 100 mL IVPB  3 g Intravenous Q6H Poindexter, Leann T, RPH 200 mL/hr at 01/09/19 1520 3 g at 01/09/19 1520  . aspirin chewable tablet 81 mg  81 mg Oral Daily Eduard ClosKakrakandy, Arshad N, MD   81 mg at 01/09/19 [id number]  . atorvastatin (LIPITOR) tablet 40 mg  40 mg Oral QHS Eduard ClosKakrakandy, Arshad N, MD   40 mg at 01/08/19 2100  . chlorhexidine (PERIDEX) 0.12 % solution 15 mL  15 mL Mouth Rinse BID Calvert Cantorizwan, Saima, MD   15 mL at 01/09/19 0842  . Chlorhexidine Gluconate Cloth 2 % PADS 6 each  6 each Topical Daily Eduard ClosKakrakandy, Arshad N, MD   6 each at 01/09/19 [id number]  . feeding supplement (OSMOLITE 1.5 CAL) liquid 1,000 mL  1,000 mL Per Tube Continuous Eduard ClosKakrakandy, Arshad N, MD 50 mL/hr at 01/08/19 1000 1,000 mL at 01/08/19 1000  . feeding supplement (PRO-STAT SUGAR FREE 64) liquid 30 mL  30 mL Per Tube TID Eduard ClosKakrakandy, Arshad N, MD   30 mL at 01/09/19 1520  . ferrous sulfate 300 (60 Fe) MG/5ML syrup 300 mg  300 mg Oral Daily Eduard ClosKakrakandy, Arshad N, MD   300 mg at 01/09/19 [id number]  . folic acid (FOLVITE) tablet 1 mg  1 mg Oral Daily Eduard ClosKakrakandy, Arshad N, MD   1 mg at 01/09/19 0840  . free water 100 mL  100 mL Per Tube Q6H Rizwan, Saima, MD   100 mL at 01/09/19 1204  . Gerhardt's butt cream   Topical TID Eduard ClosKakrakandy, Arshad N, MD   1 application at 01/09/19 1520  . heparin injection 5,000 Units  5,000 Units Subcutaneous Q8H Eduard ClosKakrakandy, Arshad N, MD   5,000 Units at 01/09/19 1318  . levothyroxine (SYNTHROID) tablet 175 mcg  175 mcg Oral Q0600 Eduard ClosKakrakandy, Arshad N, MD   175 mcg at 01/09/19 0502  . MEDLINE mouth rinse  15 mL Mouth Rinse q12n4p  Calvert Cantorizwan, Saima, MD   15 mL at 01/09/19 1520  . metoprolol tartrate (LOPRESSOR) tablet 25 mg  25 mg Oral BID Calvert Cantorizwan, Saima, MD   25 mg at 01/09/19 0845  . morphine 2 MG/ML injection 2 mg  2 mg Intravenous Q4H PRN Calvert Cantorizwan, Saima, MD   2 mg at 01/09/19 0503  . multivitamin liquid 15 mL  15 mL Oral Daily Eduard ClosKakrakandy, Arshad N, MD   15 mL at 01/09/19 0842  . ondansetron (ZOFRAN) tablet 4 mg  4 mg Oral Q6H PRN Eduard ClosKakrakandy, Arshad N, MD       Or  . ondansetron [facility](ZOFRAN) injection 4 mg  4 mg Intravenous Q6H PRN Eduard ClosKakrakandy, Arshad N, MD      . pantoprazole sodium (PROTONIX) 40 mg/20 mL oral suspension 40 mg  40 mg Oral Daily Eduard ClosKakrakandy, Arshad N, MD   40 mg at 01/09/19 [id number]  . sertraline (ZOLOFT)  tablet 50 mg  50 mg Oral Daily Eduard Clos, MD   50 mg at 01/09/19 0840  . vitamin B-12 (CYANOCOBALAMIN) tablet 1,000 mcg  1,000 mcg Oral Daily Eduard Clos, MD   1,000 mcg at 01/09/19 0840     Discharge Medications: Please see discharge summary for a list of discharge medications.  Relevant Imaging Results:  Relevant Lab Results:   Additional Information SS# [id number]. Needs IV unyson 3 g, Intravenous, 200 mL/hr, every 6 hours  total of 14 days, start date 01/08/19  Nelwyn Salisbury, LCSW

## 2019-01-10 DIAGNOSIS — L989 Disorder of the skin and subcutaneous tissue, unspecified: Secondary | ICD-10-CM

## 2019-01-10 DIAGNOSIS — E46 Unspecified protein-calorie malnutrition: Secondary | ICD-10-CM

## 2019-01-10 DIAGNOSIS — S81802A Unspecified open wound, left lower leg, initial encounter: Secondary | ICD-10-CM

## 2019-01-10 LAB — CBC WITH DIFFERENTIAL/PLATELET
Abs Immature Granulocytes: 0.78 10*3/uL — ABNORMAL HIGH (ref 0.00–0.07)
Basophils Absolute: 0 10*3/uL (ref 0.0–0.1)
Basophils Relative: 0 %
Eosinophils Absolute: 0.3 10*3/uL (ref 0.0–0.5)
Eosinophils Relative: 4 %
HCT: 31.3 % — ABNORMAL LOW (ref 39.0–52.0)
Hemoglobin: 9.5 g/dL — ABNORMAL LOW (ref 13.0–17.0)
Immature Granulocytes: 11 %
Lymphocytes Relative: 21 %
Lymphs Abs: 1.5 10*3/uL (ref 0.7–4.0)
MCH: 30.3 pg (ref 26.0–34.0)
MCHC: 30.4 g/dL (ref 30.0–36.0)
MCV: 99.7 fL (ref 80.0–100.0)
Monocytes Absolute: 0.7 10*3/uL (ref 0.1–1.0)
Monocytes Relative: 10 %
Neutro Abs: 3.8 10*3/uL (ref 1.7–7.7)
Neutrophils Relative %: 54 %
Platelets: 347 10*3/uL (ref 150–400)
RBC: 3.14 MIL/uL — ABNORMAL LOW (ref 4.22–5.81)
RDW: 16.8 % — ABNORMAL HIGH (ref 11.5–15.5)
WBC: 7.1 10*3/uL (ref 4.0–10.5)
nRBC: 0 % (ref 0.0–0.2)

## 2019-01-10 LAB — C-REACTIVE PROTEIN: CRP: 2.9 mg/dL — ABNORMAL HIGH (ref <1.0)

## 2019-01-10 LAB — COMPREHENSIVE METABOLIC PANEL
ALT: 26 U/L (ref 0–44)
AST: 39 U/L (ref 15–41)
Albumin: 1.9 g/dL — ABNORMAL LOW (ref 3.5–5.0)
Alkaline Phosphatase: 42 U/L (ref 38–126)
Anion gap: 10 (ref 5–15)
BUN: 31 mg/dL — ABNORMAL HIGH (ref 8–23)
CO2: 25 mmol/L (ref 22–32)
Calcium: 7.6 mg/dL — ABNORMAL LOW (ref 8.9–10.3)
Chloride: 108 mmol/L (ref 98–111)
Creatinine, Ser: 0.51 mg/dL — ABNORMAL LOW (ref 0.61–1.24)
GFR calc Af Amer: >60 mL/min (ref >60)
GFR calc non Af Amer: >60 mL/min (ref >60)
Glucose, Bld: 105 mg/dL — ABNORMAL HIGH (ref 70–99)
Potassium: 4.2 mmol/L (ref 3.5–5.1)
Sodium: 143 mmol/L (ref 135–145)
Total Bilirubin: 0.5 mg/dL (ref 0.3–1.2)
Total Protein: 5.5 g/dL — ABNORMAL LOW (ref 6.5–8.1)

## 2019-01-10 LAB — GLUCOSE, CAPILLARY
Glucose-Capillary: 91 mg/dL (ref 70–99)
Glucose-Capillary: 105 mg/dL — ABNORMAL HIGH (ref 70–99)
Glucose-Capillary: 120 mg/dL — ABNORMAL HIGH (ref 70–99)

## 2019-01-10 LAB — MAGNESIUM: Magnesium: 2.3 mg/dL (ref 1.7–2.4)

## 2019-01-10 LAB — D-DIMER, QUANTITATIVE: D-Dimer, Quant: 1.72 ug/mL-FEU — ABNORMAL HIGH (ref 0.00–0.50)

## 2019-01-10 LAB — FERRITIN: Ferritin: 1324 ng/mL — ABNORMAL HIGH (ref 24–336)

## 2019-01-10 LAB — PHOSPHORUS: Phosphorus: 2.1 mg/dL — ABNORMAL LOW (ref 2.5–4.6)

## 2019-01-10 MED ORDER — PRO-STAT SUGAR FREE PO LIQD: 30.0000 mL | Freq: Three times a day (TID) | ORAL | 0 refills | Status: AC

## 2019-01-10 MED ORDER — FREE WATER: 100.0000 mL | Freq: Four times a day (QID) | Status: AC

## 2019-01-10 MED ORDER — OSMOLITE 1.5 CAL PO LIQD
1000.0000 mL | ORAL | Status: DC
  Administered 2019-01-10: 1000 mL
  Filled 2019-01-10 (×3): qty 1000

## 2019-01-10 MED ORDER — PRO-STAT SUGAR FREE PO LIQD: 30.0000 mL | Freq: Every day | ORAL | Status: DC

## 2019-01-10 MED ORDER — FUROSEMIDE 20 MG PO TABS: 20.0000 mg | ORAL_TABLET | Freq: Every day | ORAL | Status: AC | PRN

## 2019-01-10 MED ORDER — OSMOLITE 1.5 CAL PO LIQD: 1000.0000 mL | ORAL | 0 refills | Status: DC

## 2019-01-10 MED ORDER — SODIUM CHLORIDE 0.9% FLUSH
10.0000 mL | INTRAVENOUS | Status: DC | PRN
  Administered 2019-01-11: 10 mL
  Filled 2019-01-10: qty 40

## 2019-01-10 MED ORDER — OSMOLITE 1.5 CAL PO LIQD: 1000.0000 mL | ORAL | 0 refills | Status: AC

## 2019-01-10 MED ORDER — AMPICILLIN-SULBACTAM IV (FOR PTA / DISCHARGE USE ONLY): 3.0000 g | Freq: Four times a day (QID) | INTRAVENOUS | 0 refills | Status: AC

## 2019-01-10 MED ORDER — PRO-STAT SUGAR FREE PO LIQD: 30.0000 mL | Freq: Every day | ORAL | 0 refills | Status: AC

## 2019-01-10 NOTE — Progress Notes (Signed)
Peripherally Inserted Central Catheter/Midline Placement  The IV Nurse has discussed with the patient and/or persons authorized to consent for the patient, the purpose of this procedure and the potential benefits and risks involved with this procedure.  The benefits include less needle sticks, lab draws from the catheter, and the patient may be discharged home with the catheter. Risks include, but not limited to, infection, bleeding, blood clot (thrombus formation), and puncture of an artery; nerve damage and irregular heartbeat and possibility to perform a PICC exchange if needed/ordered by physician.  Alternatives to this procedure were also discussed.  [facility] [employer] PICC patient education guide, fact sheet on infection prevention and patient information card has been provided to patient /or left at bedside.    PICC/Midline Placement Documentation  PICC Single Lumen 08/65/78 PICC Right Basilic 41 cm 0 cm (Active)  Indication for Insertion or Continuance of Line Home intravenous therapies (PICC only) 01/10/19 1712  Exposed Catheter (cm) 0 cm 01/10/19 1712  Site Assessment Clean;Dry;Intact 01/10/19 1712  Line Status Flushed;Blood return noted;Saline locked 01/10/19 1712  Dressing Type Transparent 01/10/19 1712  Dressing Status Clean;Dry;Intact;Antimicrobial disc in place 01/10/19 1712  Dressing Change Due 01/17/19 01/10/19 1712       Scotty Court 01/10/2019, 5:14 PM

## 2019-01-10 NOTE — Progress Notes (Signed)
PHARMACY CONSULT NOTE FOR:  OUTPATIENT  PARENTERAL ANTIBIOTIC THERAPY (OPAT)  Indication: Acinetobacter baumannii bacteremia  Regimen: Ampicillin/Sulbactam 3g IV q6h End date: 01/19/2019  IV antibiotic discharge orders are pended. To discharging provider:  please sign these orders via discharge navigator,  Select New Orders & click on the button choice - Manage This Unsigned Work.     Thank you for allowing pharmacy to be a part of this patient's care.  Lindell Spar M 01/10/2019, 1:19 PM

## 2019-01-10 NOTE — Discharge Summary (Signed)
Physician Discharge Summary  Logan Blackwell EXN:[id number] DOB: Dec 18, 1929 DOA: 01/05/2019  PCP: Patient, No Pcp Per  Admit date: 01/05/2019 Discharge date: 01/10/2019  Admitted From: SNF Disposition:  SNF   Recommendations for Outpatient Follow-up:  1. F/u on left arm edema 2. Unasyn to d/c on 12/3   Discharge Condition:  stable   CODE STATUS:  DNF   Diet recommendation:  Pureed diet with PEG feeds and no more than 400 cc of Free water Consultations:  ID    Discharge Diagnoses:  Principal Problem:   Sepsis   Active Problems:   Cellulitis of left arm   COVID-19 virus infection Acute metabolic encephalopathy Acinetobacter bacteremia   Pressure injury of skin   A-fib (HCC)   AICD (automatic cardioverter/defibrillator) present   PEG (percutaneous endoscopic gastrostomy) status ([place])     Brief Summary: Logan Blackwell is a 85 y.o.malewho resides in a SNF withhistory of AICD placement, A. fib, DVT hypothyroidism, chronic back pain, CAD,  H/o of hemarthrosis admitted from July to Aug for septic shock and subsequently developed cognitive impairment, received a PEG tube (in Aug). He recently diagnosed with Covid 19 infection and because of this he was transferred to a SNF in [place] from [facility] rehab in [place]. He was found to have blood-tinged discharge from his wounds on  lower extremity and was transferred to [facility]. Patient is not able to contribute anything to the history.    CXR in ED> Bibasilar atelectasis or infiltrates. Possible small left effusion versus pleural thickening.  Hospital Course:  Principal Problem:   Severe Sepsis with tachycardia, leukocytosis, lactic acidosis and elevated procalcitonin - suspected to be due to left arm, cellulitis, bacteremia and also COVID 19 - on exam, noted to have extensive erythema and edema of his left arm - CT of this arm suggestive of cellulitis and myofasciitis - Venous duplex was not an adequate  study due to pain but no DVT noted - 1 set of blood cultures from admission subsequently found to be + with gram neg rods-  BCID > Acinetobacter baumanii  - has numerous skin tears and wounds which could have lead to the cellulitis and subsequent bacteremia - I have asked for an ID opinion - At this time, recommendations from ID are to continue Anitbitoics (Unasyn) for a total of 14 days using 11/30 as day one- end dated 12/13. - discussed PICC line with son, Francee Piccolo. He is in agreement with this.  Active Problems:     COVID-19 virus infection - asymptomatic- no hypoxia - According to his son, he tested positive on 11/29 and because of testing positive, he was transferred to a SNF in [place] - initial CXR suggestive of lung infiltrates but repeat is more consistent with atelectasis - Inflammatory marker improving    Hypokalemia, Hypophosphatemia - replaced     A-fib  - not on anticoagulation as outpt - cont Metoprolol  AICD (automatic cardioverter/defibrillator) present  Chronic diastolic CHF- dehydration - he receives Lasix as outpt which is currently on hold due to dehydration as evidenced by physical exam, hyponatremia and elevated BUN/Cr ratio  - I have changed Lasix to PRN and feel that if his free water is limited, he does not need daily Lasix  Nutrition/ PEG status  - tube feeds ordered based on dietician recommendations > Osmolite 1.5 at 70cc/hr which will provide 1780 kcal (82% estimated kcal need), 85 grams protein (81% estimated protein need), and 1253 ml free water.  - 100 cc free  water QID also ordered - of note, the patient does eat a pureed foods but not enough to maintain adequate nutrition- an SLP eval was done and is was recommended to maintain him on a pureed diet  Acute metabolic encephalopathy superimposed on baseline cognitive issues - likely due to acute illness- appears to be improving-  Hypothyroid - cont Synthroid  Anemia -  anemia  panel  GERD? -cont Protonix  Extensive wounds and skin tears  - I have asked for wound care to assist with managing these-   Rash on scalp - recommend derm consult as outpt  Bed bound with PEG and Cognitive impairment - since last admission in July/Aug for septic shock  Code Status - DNR per both sons    Discharge Exam: Vitals:   01/09/19 2005 01/10/19 0518  BP: (!) 114/91 (!) 144/127  Pulse: 66 60  Resp: 20 20  Temp: 99.1 F (37.3 C) 97.9 F (36.6 C)  SpO2: 95% 93%   Vitals:   01/09/19 1851 01/09/19 2005 01/10/19 0500 01/10/19 0518  BP: (!) 148/39 (!) 114/91  (!) 144/127  Pulse: 62 66  60  Resp: '16 20  20  ' Temp: 98.7 F (37.1 C) 99.1 F (37.3 C)  97.9 F (36.6 C)  TempSrc: Oral Oral  Oral  SpO2: 94% 95%  93%  Weight:   90.2 kg   Height:        General: Pt is alert, awake, not in acute distress Cardiovascular: RRR, S1/S2 +, no rubs, no gallops Respiratory: CTA bilaterally, no wheezing, no rhonchi Abdominal: Soft, NT, ND, bowel sounds + Extremities: no edema, no cyanosis   Discharge Instructions  Discharge Instructions    Home infusion instructions [facility] May follow [place] Dosing Protocol; May administer Cathflo as needed to maintain patency of vascular access device.; Flushing of vascular access device: per [facility] Protocol: 0.9% NaCl pre/post medica...   Complete by: As directed    Instructions: May follow [place] Dosing Protocol   Instructions: May administer Cathflo as needed to maintain patency of vascular access device.   Instructions: Flushing of vascular access device: per [facility] Protocol: 0.9% NaCl pre/post medication administration and prn patency; Heparin 100 u/ml, 53m for implanted ports and Heparin 10u/ml, 547mfor all other central venous catheters.   Instructions: May follow [facility] Anaphylaxis Protocol for First Dose Administration in the home: 0.9% NaCl at 25-50 ml/hr to maintain IV access for protocol meds. Epinephrine 0.3  ml IV/IM PRN and Benadryl 25-50 IV/IM PRN s/s of anaphylaxis.   Instructions: [facility] Coordinator (RN) to assist per patient IV care needs in the home PRN.   Increase activity slowly   Complete by: As directed      Allergies as of 01/10/2019   No Known Allergies     Medication List    TAKE these medications   acetaminophen 325 MG tablet Commonly known as: TYLENOL Take 650 mg by mouth every 8 (eight) hours as needed for mild pain, moderate pain, fever or headache.   ampicillin-sulbactam  IVPB Commonly known as: UNASYN Inject 3 g into the vein every 6 (six) hours for 9 days. Indication: Acinetobacter baumannii bacteremia  Last Day of Therapy: 01/19/2019 Labs - Once weekly:  CBC/D and BMP, Labs - Every other week:  ESR and CRP   aspirin 81 MG chewable tablet Chew 81 mg by mouth daily.   atorvastatin 40 MG tablet Commonly known as: LIPITOR Take 40 mg by mouth at bedtime.  emollient cream Commonly known as: BIAFINE Apply 1 application topically daily.   feeding supplement (OSMOLITE 1.5 CAL) Liqd Place 1,000 mLs into feeding tube continuous.   feeding supplement (PRO-STAT SUGAR FREE 64) Liqd Take 30 mLs by mouth 3 (three) times daily with meals. What changed: Another medication with the same name was added. Make sure you understand how and when to take each.   feeding supplement (PRO-STAT SUGAR FREE 64) Liqd Place 30 mLs into feeding tube daily. Start taking on: January 11, 2019 What changed: You were already taking a medication with the same name, and this prescription was added. Make sure you understand how and when to take each.   Ferrous Sulfate 220 (44 Fe) MG/5ML Liqd Take 5 mLs by mouth daily.   folic acid 1 MG tablet Commonly known as: FOLVITE Take 1 mg by mouth daily.   free water Soln Place 100 mLs into feeding tube every 6 (six) hours.   furosemide 20 MG tablet Commonly known as: LASIX Take 1 tablet (20 mg total) by mouth daily as  needed. PRN weight gain of 2 lb in 48 hrs or 3 lbs in 1 wk or edema What changed:   medication strength  how much to take  when to take this  reasons to take this  additional instructions   ketoconazole 2 % cream Commonly known as: NIZORAL Apply 1 application topically 2 (two) times daily.   levothyroxine 175 MCG tablet Commonly known as: SYNTHROID Take 175 mcg by mouth daily before breakfast.   Melatonin 3 MG Caps Take 3 mg by mouth at bedtime.   metoprolol tartrate 25 MG tablet Commonly known as: LOPRESSOR Take 25 mg by mouth 2 (two) times daily.   multivitamin Liqd Take 5 mLs by mouth daily.   potassium chloride 20 MEQ/15ML (10%) Soln Take 20 mEq by mouth daily.   Protonix 40 mg/20 mL Pack Generic drug: pantoprazole sodium Take 40 mg by mouth daily.   sertraline 50 MG tablet Commonly known as: ZOLOFT Take 50 mg by mouth daily.   vitamin B-12 1000 MCG tablet Commonly known as: CYANOCOBALAMIN Take 1,000 mcg by mouth daily.            Home Infusion Instuctions  (From admission, onward)         Start     Ordered   01/10/19 0000  Home infusion instructions [facility] May follow [place] Dosing Protocol; May administer Cathflo as needed to maintain patency of vascular access device.; Flushing of vascular access device: per [facility] Protocol: 0.9% NaCl pre/post medica...    Question Answer Comment  Instructions May follow [place] Dosing Protocol   Instructions May administer Cathflo as needed to maintain patency of vascular access device.   Instructions Flushing of vascular access device: per [facility] Protocol: 0.9% NaCl pre/post medication administration and prn patency; Heparin 100 u/ml, 60m for implanted ports and Heparin 10u/ml, 576mfor all other central venous catheters.   Instructions May follow [facility] Anaphylaxis Protocol for First Dose Administration in the home: 0.9% NaCl at 25-50 ml/hr to maintain IV access for protocol meds. Epinephrine 0.3 ml  IV/IM PRN and Benadryl 25-50 IV/IM PRN s/s of anaphylaxis.   Instructions [facility] Infusion Coordinator (RN) to assist per patient IV care needs in the home PRN.      01/10/19 1528         Contact information for after-discharge care    [facility] .  Service: Skilled Nursing Contact information: [place] [phone]             No Known Allergies   Procedures/Studies: Procedures:  2 D ECHO 1. Left ventricular ejection fraction, by visual estimation, is 50 to 55%. The left ventricle has normal function. There is severely increased left ventricular hypertrophy. 2. Elevated left atrial pressure. 3. Indeterminate diastolic filling due to E-A fusion. 4. Poor acoustic windows limit study. 5. Global right ventricle has normal systolic function.The right ventricular size is normal. No increase in right ventricular wall thickness. 6. Left atrial size was normal. 7. Right atrial size was normal. 8. The mitral valve is grossly normal. Mild mitral valve regurgitation. 9. The tricuspid valve is normal in structure. Tricuspid valve regurgitation is mild. 10. The aortic valve is tricuspid. Aortic valve regurgitation is mild. Mild aortic valve stenosis. 11. The pulmonic valve was grossly normal. Pulmonic valve regurgitation is mild. 12. Mildly elevated pulmonary artery systolic pressure.  Dg Tibia/fibula Left  Result Date: 01/06/2019 CLINICAL DATA:  Bleeding wounds EXAM: LEFT TIBIA AND FIBULA - 2 VIEW COMPARISON:  None. FINDINGS: Diffuse vascular calcifications. No acute bony abnormality. Specifically, no fracture, subluxation, or dislocation. IMPRESSION: No acute bony abnormality. Electronically Signed   By: Rolm Baptise M.D.   On: 01/06/2019 01:17   Dg Tibia/fibula Right  Result Date: 01/06/2019 CLINICAL DATA:  Bleeding wounds EXAM: RIGHT TIBIA AND FIBULA - 2 VIEW COMPARISON:  None. FINDINGS:  Diffuse vascular calcifications. No acute bony abnormality. Specifically, no fracture, subluxation, or dislocation. IMPRESSION: No acute bony abnormality. Electronically Signed   By: Rolm Baptise M.D.   On: 01/06/2019 01:16   Dg Chest Port 1 View  Result Date: 01/07/2019 CLINICAL DATA:  Septic, weakness. EXAM: PORTABLE CHEST 1 VIEW COMPARISON:  Chest x-ray dated 01/06/2019. FINDINGS: Stable cardiomegaly. Overall cardiomediastinal silhouette appears stable in size and configuration. LEFT chest wall pacemaker/ICD apparatus appears stable. Probable mild bibasilar atelectasis and/or small pleural effusions. Lungs otherwise clear. No pneumothorax seen. Aortic atherosclerosis. IMPRESSION: 1. Stable cardiomegaly. 2. Probable mild bibasilar atelectasis and/or small pleural effusions. 3. Aortic atherosclerosis. 4. No new lung findings. Electronically Signed   By: Franki Cabot M.D.   On: 01/07/2019 11:18   Dg Chest Port 1 View  Result Date: 01/06/2019 CLINICAL DATA:  COVID positive EXAM: PORTABLE CHEST 1 VIEW COMPARISON:  None. FINDINGS: Left pacer in place with leads in the right atrium and right ventricle. Aortic calcifications. Low lung volumes. Right infrahilar atelectasis or infiltrate. Left base atelectasis and possible small left effusion versus pleural thickening. Left hilar or infrahilar calcification compatible with old granulomatous disease. IMPRESSION: Bibasilar atelectasis or infiltrates. Possible small left effusion versus pleural thickening. Electronically Signed   By: Rolm Baptise M.D.   On: 01/06/2019 01:18   Vas [place] Upper Extremity Venous Duplex  Result Date: 01/08/2019 UPPER VENOUS STUDY  Indications: Pain, Swelling, and cellulitis and myofascitis of the left upper extremity Limitations: Poor ultrasound/tissue interface, body habitus and patient in significant pain. Restricted mobility. Comparison Study: No prior study. Performing Technologist: Maudry Mayhew MHA, RDMS, RVT, RDCS   Examination Guidelines: A complete evaluation includes B-mode imaging, spectral Doppler, color Doppler, and power Doppler as needed of all accessible portions of each vessel. Bilateral testing is considered an integral part of a complete examination. Limited examinations for reoccurring indications may be performed as noted.  Left Findings: +----------+------------+---------+-----------+----------+-------+ LEFT      CompressiblePhasicitySpontaneousPropertiesSummary +----------+------------+---------+-----------+----------+-------+ IJV  Full       Yes       Yes                      +----------+------------+---------+-----------+----------+-------+ Subclavian               Yes       Yes                      +----------+------------+---------+-----------+----------+-------+ Axillary                 Yes       Yes                      +----------+------------+---------+-----------+----------+-------+ Brachial                 Yes       Yes                      +----------+------------+---------+-----------+----------+-------+ Radial                   Yes       Yes                      +----------+------------+---------+-----------+----------+-------+ Ulnar                    Yes       Yes                      +----------+------------+---------+-----------+----------+-------+ Cephalic      Full                                          +----------+------------+---------+-----------+----------+-------+ Basilic       Full                                          +----------+------------+---------+-----------+----------+-------+  Summary:  Left: Many compression maneuvers not performed secondary to pain. No evidence of acute DVT involving the left upper extremity by color and pulsed wave Doppler.  *See table(s) above for measurements and observations.  Diagnosing physician: Harold Barban MD Electronically signed by Harold Barban MD on 01/08/2019 at 2:42:40  PM.    Final    [place] Ekg Site Rite  Result Date: 01/09/2019 If Site Rite image not attached, placement could not be confirmed due to current cardiac rhythm.  Ct Extrem Up Entire Arm L W/cm  Result Date: 01/06/2019 CLINICAL DATA:  Left upper extremity pain and swelling. EXAM: CT OF THE UPPER LEFT EXTREMITY WITH CONTRAST TECHNIQUE: Multidetector CT imaging of the upper left extremity was performed according to the standard protocol following intravenous contrast administration. CONTRAST:  113m OMNIPAQUE IOHEXOL 300 MG/ML  SOLN COMPARISON:  None. FINDINGS: Diffuse subcutaneous soft tissue swelling/edema/fluid most notably involving the forearm area. No discrete rim enhancing fluid collection to suggest a drainable soft tissue abscess. Suspect myofasciitis involving the forearm musculature without obvious changes of pyomyositis. Degenerative changes are noted at the shoulder, elbow, wrist and hand and hip joints. No obvious destructive bony changes to suggest osteomyelitis. No obvious CT findings for septic arthritis. Severe rotator cuff disease. Advanced vascular calcifications. Incidental note is made of a small  left pleural effusion and overlying atelectasis. Calcified mediastinal lymph nodes are noted. IMPRESSION: 1. Cellulitis and myofasciitis involving the left forearm in particular but no definite CT findings for discrete drainable subcutaneous abscess or pyomyositis. 2. Advanced degenerative changes involving all of the joints but no obvious CT findings to suggest septic arthritis or osteomyelitis. 3. Severe/advanced vascular disease. 4. Small left pleural effusion and overlying atelectasis. Electronically Signed   By: Marijo Sanes M.D.   On: 01/06/2019 06:37     The results of significant diagnostics from this hospitalization (including imaging, microbiology, ancillary and laboratory) are listed below for reference.     Microbiology: Recent Results (from the past 240 hour(s))  Blood Culture  (routine x 2)     Status: Abnormal   Collection Time: 01/05/19 11:34 PM   Specimen: BLOOD  Result Value Ref Range Status   Specimen Description   Final    BLOOD LEFT ANTECUBITAL Performed at [facility] [address] [place], [place] [address]    Special Requests   Final    BOTTLES DRAWN AEROBIC AND ANAEROBIC Blood Culture adequate volume Performed at [facility] [address] [place], [place] [address]    Culture  Setup Time   Final    GRAM NEGATIVE RODS AEROBIC BOTTLE ONLY CRITICAL RESULT CALLED TO, READ BACK BY AND VERIFIED WITH: Melodye Ped PharmD 15:50 01/06/19 ([email or web address]) Performed at [facility], [place] [address] [place], [place] [address]    Culture ACINETOBACTER BAUMANNII (A)  Final   Report Status 01/08/2019 FINAL  Final   Organism ID, Bacteria ACINETOBACTER BAUMANNII  Final      Susceptibility   Acinetobacter baumannii - MIC*    CEFTAZIDIME 4 SENSITIVE Sensitive     CEFTRIAXONE 16 INTERMEDIATE Intermediate     CIPROFLOXACIN <=0.25 SENSITIVE Sensitive     GENTAMICIN <=1 SENSITIVE Sensitive     IMIPENEM <=0.25 SENSITIVE Sensitive     PIP/TAZO <=4 SENSITIVE Sensitive     TRIMETH/SULFA <=20 SENSITIVE Sensitive     CEFEPIME 2 SENSITIVE Sensitive     AMPICILLIN/SULBACTAM <=2 SENSITIVE Sensitive     * ACINETOBACTER BAUMANNII  Urine culture     Status: None   Collection Time: 01/05/19 11:34 PM   Specimen: Urine, Catheterized  Result Value Ref Range Status   Specimen Description   Final    URINE, CATHETERIZED Performed at [facility], [place] [address], [place], [place] [address]    Special Requests   Final    NONE Performed at [facility], [place] [address] [place], [place] [address]    Culture   Final    NO GROWTH Performed at [facility], [place] [address], [place], [place] [address]    Report Status 01/06/2019 FINAL  Final  Blood Culture ID Panel (Reflexed)     Status:  Abnormal   Collection Time: 01/05/19 11:34 PM  Result Value Ref Range Status   Enterococcus species NOT DETECTED NOT DETECTED Final   Listeria monocytogenes NOT DETECTED NOT DETECTED Final   Staphylococcus species NOT DETECTED NOT DETECTED Final   Staphylococcus aureus (BCID) NOT DETECTED NOT DETECTED Final   Streptococcus species NOT DETECTED NOT DETECTED Final   Streptococcus agalactiae NOT DETECTED NOT DETECTED Final   Streptococcus pneumoniae NOT DETECTED NOT DETECTED Final   Streptococcus pyogenes NOT DETECTED NOT DETECTED Final   Acinetobacter baumannii DETECTED (A) NOT DETECTED Final    Comment: CRITICAL RESULT CALLED TO, READ BACK BY AND  VERIFIED WITH: Melodye Ped PharmD 15:50 01/06/19 ([email or web address])    Enterobacteriaceae species NOT DETECTED NOT DETECTED Final   Enterobacter cloacae complex NOT DETECTED NOT DETECTED Final   Escherichia coli NOT DETECTED NOT DETECTED Final   Klebsiella oxytoca NOT DETECTED NOT DETECTED Final   Klebsiella pneumoniae NOT DETECTED NOT DETECTED Final   Proteus species NOT DETECTED NOT DETECTED Final   Serratia marcescens NOT DETECTED NOT DETECTED Final   Carbapenem resistance NOT DETECTED NOT DETECTED Final   Haemophilus influenzae NOT DETECTED NOT DETECTED Final   Neisseria meningitidis NOT DETECTED NOT DETECTED Final   Pseudomonas aeruginosa NOT DETECTED NOT DETECTED Final   Candida albicans NOT DETECTED NOT DETECTED Final   Candida glabrata NOT DETECTED NOT DETECTED Final   Candida krusei NOT DETECTED NOT DETECTED Final   Candida parapsilosis NOT DETECTED NOT DETECTED Final   Candida tropicalis NOT DETECTED NOT DETECTED Final    Comment: Performed at [facility], [place] [address], [place], [place] [address]  MRSA PCR Screening     Status: None   Collection Time: 01/06/19  8:20 AM   Specimen: Nasal Mucosa; Nasopharyngeal  Result Value Ref Range Status   MRSA by PCR NEGATIVE NEGATIVE Final    Comment:        The GeneXpert MRSA Assay  (FDA approved for NASAL specimens only), is one component of a comprehensive MRSA colonization surveillance program. It is not intended to diagnose MRSA infection nor to guide or monitor treatment for MRSA infections. Performed at [facility], [place] [address] [place], [place] [address]   Culture, blood (routine x 2)     Status: None (Preliminary result)   Collection Time: 01/06/19  5:34 PM   Specimen: Right Antecubital; Blood  Result Value Ref Range Status   Specimen Description   Final    RIGHT ANTECUBITAL Performed at [facility] [address], [place], [place] [address]    Special Requests   Final    BOTTLES DRAWN AEROBIC ONLY Blood Culture adequate volume Performed at [facility] [address], [place], [place] [address]    Culture   Final    NO GROWTH 4 DAYS Performed at [facility], [place] [address], [place], [place] [address]    Report Status PENDING  Incomplete  Culture, blood (routine x 2)     Status: None (Preliminary result)   Collection Time: 01/06/19  5:44 PM   Specimen: Right Antecubital; Blood  Result Value Ref Range Status   Specimen Description   Final    RIGHT ANTECUBITAL Performed at [facility] [address] [place], [place] [address]    Special Requests   Final    BOTTLES DRAWN AEROBIC AND ANAEROBIC Blood Culture adequate volume Performed at [facility] [address] [place], [place] [address]    Culture   Final    NO GROWTH 4 DAYS Performed at [facility], [place] [address] [place],  [address]    Report Status PENDING  Incomplete  Respiratory Panel by PCR     Status: None   Collection Time: 01/07/19 11:30 AM   Specimen: Nasopharyngeal Swab; Respiratory  Result Value Ref Range Status   Adenovirus NOT DETECTED NOT DETECTED Final   Coronavirus 229E NOT DETECTED NOT DETECTED Final    Comment: (NOTE) The Coronavirus on the  Respiratory Panel, DOES NOT test for the novel  Coronavirus (2019 nCoV)    Coronavirus HKU1 NOT  DETECTED NOT DETECTED Final   Coronavirus NL63 NOT DETECTED NOT DETECTED Final   Coronavirus OC43 NOT DETECTED NOT DETECTED Final   Metapneumovirus NOT DETECTED NOT DETECTED Final   Rhinovirus / Enterovirus NOT DETECTED NOT DETECTED Final   Influenza A NOT DETECTED NOT DETECTED Final   Influenza B NOT DETECTED NOT DETECTED Final   Parainfluenza Virus 1 NOT DETECTED NOT DETECTED Final   Parainfluenza Virus 2 NOT DETECTED NOT DETECTED Final   Parainfluenza Virus 3 NOT DETECTED NOT DETECTED Final   Parainfluenza Virus 4 NOT DETECTED NOT DETECTED Final   Respiratory Syncytial Virus NOT DETECTED NOT DETECTED Final   Bordetella pertussis NOT DETECTED NOT DETECTED Final   Chlamydophila pneumoniae NOT DETECTED NOT DETECTED Final   Mycoplasma pneumoniae NOT DETECTED NOT DETECTED Final    Comment: Performed at [facility], [place] [address], [place], [place] [address]     Labs: BNP (last 3 results) No results for input(s): BNP in the last 8760 hours. Basic Metabolic Panel: Recent Labs  Lab 01/06/19 0648 01/07/19 0210 01/08/19 0210 01/09/19 0241 01/10/19 0404  NA 134* 140 144 142 143  K 3.6 2.9* 4.3 4.1 4.2  CL 100 106 112* 110 108  CO2 '25 25 23 26 25  ' GLUCOSE 105* 131* 104* 128* 105*  BUN 47* 45* 39* 36* 31*  CREATININE 0.87 0.68 0.59* 0.69 0.51*  CALCIUM 7.9* 7.7* 7.8* 7.3* 7.6*  MG  --  2.0 2.1 2.2 2.3  PHOS  --  2.2* 1.4* 2.1* 2.1*   Liver Function Tests: Recent Labs  Lab 01/06/19 0648 01/07/19 0210 01/08/19 0210 01/09/19 0241 01/10/19 0404  AST 42* 42* 35 32 39  ALT '23 23 25 24 26  ' ALKPHOS 45 44 45 40 42  BILITOT 0.8 0.7 0.7 0.9 0.5  PROT 5.8* 5.6* 5.7* 5.1* 5.5*  ALBUMIN 2.1* 2.0* 2.0* 1.9* 1.9*   No results for input(s): LIPASE, AMYLASE in the last 168 hours. No results for input(s): AMMONIA in the last 168 hours. CBC: Recent Labs  Lab 01/06/19 0648  01/07/19 0210 01/08/19 0210 01/09/19 0241 01/10/19 0404  WBC 13.1* 8.5 5.6 4.3 7.1  NEUTROABS 11.3* 7.3 4.0 2.2 3.8  HGB 10.4* 9.5* 8.9* 8.6* 9.5*  HCT 32.4* 30.7* 29.0* 28.2* 31.3*  MCV 95.9 98.4 98.0 98.3 99.7  PLT 227 261 342 268 347   Cardiac Enzymes: No results for input(s): CKTOTAL, CKMB, CKMBINDEX, TROPONINI in the last 168 hours. BNP: Invalid input(s): POCBNP CBG: Recent Labs  Lab 01/09/19 0832 01/09/19 1207 01/09/19 1522 01/09/19 2021 01/10/19 1232  GLUCAP 123* 117* 104* 127* 91   D-Dimer Recent Labs    01/09/19 0241 01/10/19 0404  DDIMER 1.35* 1.72*   Hgb A1c No results for input(s): HGBA1C in the last 72 hours. Lipid Profile No results for input(s): CHOL, HDL, LDLCALC, TRIG, CHOLHDL, LDLDIRECT in the last 72 hours. Thyroid function studies No results for input(s): TSH, T4TOTAL, T3FREE, THYROIDAB in the last 72 hours.  Invalid input(s): FREET3 Anemia work up Recent Labs    01/08/19 0210 01/09/19 0241 01/10/19 0404  VITAMINB12 606  --   --   FOLATE 16.2  --   --   FERRITIN 1,679* 1,708* 1,324*  RETICCTPCT 1.0  --   --    Urinalysis    Component Value Date/Time   COLORURINE YELLOW 01/05/2019 2334   APPEARANCEUR HAZY (A) 01/05/2019 2334   LABSPEC 1.015 01/05/2019 2334   PHURINE 5.0 01/05/2019 [place] 01/05/2019 2334  HGBUR MODERATE (A) 01/05/2019 2334   [place] NEGATIVE 01/05/2019 2334   [place] 01/05/2019 2334   PROTEINUR NEGATIVE 01/05/2019 2334   NITRITE NEGATIVE 01/05/2019 2334   LEUKOCYTESUR NEGATIVE 01/05/2019 2334   Sepsis Labs Invalid input(s): PROCALCITONIN,  WBC,  LACTICIDVEN Microbiology Recent Results (from the past 240 hour(s))  Blood Culture (routine x 2)     Status: Abnormal   Collection Time: 01/05/19 11:34 PM   Specimen: BLOOD  Result Value Ref Range Status   Specimen Description   Final    BLOOD LEFT ANTECUBITAL Performed at [facility], [place] [address]  [place], [place] [address]    Special Requests   Final    BOTTLES DRAWN AEROBIC AND ANAEROBIC Blood Culture adequate volume Performed at [place] [address] [place], [place] [address]    Culture  Setup Time   Final    GRAM NEGATIVE RODS AEROBIC BOTTLE ONLY CRITICAL RESULT CALLED TO, READ BACK BY AND VERIFIED WITH: Melodye Ped PharmD 15:50 01/06/19 ([email or web address]) Performed at [facility], [place] [address] [place], [place] [address]    Culture ACINETOBACTER BAUMANNII (A)  Final   Report Status 01/08/2019 FINAL  Final   Organism ID, Bacteria ACINETOBACTER BAUMANNII  Final      Susceptibility   Acinetobacter baumannii - MIC*    CEFTAZIDIME 4 SENSITIVE Sensitive     CEFTRIAXONE 16 INTERMEDIATE Intermediate     CIPROFLOXACIN <=0.25 SENSITIVE Sensitive     GENTAMICIN <=1 SENSITIVE Sensitive     IMIPENEM <=0.25 SENSITIVE Sensitive     PIP/TAZO <=4 SENSITIVE Sensitive     TRIMETH/SULFA <=20 SENSITIVE Sensitive     CEFEPIME 2 SENSITIVE Sensitive     AMPICILLIN/SULBACTAM <=2 SENSITIVE Sensitive     * ACINETOBACTER BAUMANNII  Urine culture     Status: None   Collection Time: 01/05/19 11:34 PM   Specimen: Urine, Catheterized  Result Value Ref Range Status   Specimen Description   Final    URINE, CATHETERIZED Performed at [facility], [place] [address] [place], [place] [address]    Special Requests   Final    NONE Performed at [facility], [place] [address] [place], [place] [address]    Culture   Final    NO GROWTH Performed at [facility], [place] [address], [place], [place] [address]    Report Status 01/06/2019 FINAL  Final  Blood Culture ID Panel (Reflexed)     Status: Abnormal   Collection Time: 01/05/19 11:34 PM  Result Value Ref Range Status   Enterococcus species NOT DETECTED NOT DETECTED Final   Listeria monocytogenes NOT DETECTED NOT DETECTED Final   Staphylococcus species NOT DETECTED NOT DETECTED Final    Staphylococcus aureus (BCID) NOT DETECTED NOT DETECTED Final   Streptococcus species NOT DETECTED NOT DETECTED Final   Streptococcus agalactiae NOT DETECTED NOT DETECTED Final   Streptococcus pneumoniae NOT DETECTED NOT DETECTED Final   Streptococcus pyogenes NOT DETECTED NOT DETECTED Final   Acinetobacter baumannii DETECTED (A) NOT DETECTED Final    Comment: CRITICAL RESULT CALLED TO, READ BACK BY AND VERIFIED WITH: Melodye Ped PharmD 15:50 01/06/19 ([email or web address])    Enterobacteriaceae species NOT DETECTED NOT DETECTED Final   Enterobacter cloacae complex NOT DETECTED NOT DETECTED Final   Escherichia coli NOT DETECTED NOT DETECTED Final   Klebsiella oxytoca NOT DETECTED NOT DETECTED Final   Klebsiella pneumoniae NOT DETECTED NOT DETECTED Final   Proteus species NOT DETECTED NOT  DETECTED Final   Serratia marcescens NOT DETECTED NOT DETECTED Final   Carbapenem resistance NOT DETECTED NOT DETECTED Final   Haemophilus influenzae NOT DETECTED NOT DETECTED Final   Neisseria meningitidis NOT DETECTED NOT DETECTED Final   Pseudomonas aeruginosa NOT DETECTED NOT DETECTED Final   Candida albicans NOT DETECTED NOT DETECTED Final   Candida glabrata NOT DETECTED NOT DETECTED Final   Candida krusei NOT DETECTED NOT DETECTED Final   Candida parapsilosis NOT DETECTED NOT DETECTED Final   Candida tropicalis NOT DETECTED NOT DETECTED Final    Comment: Performed at [facility], [place] [address], [place], [place] [address]  MRSA PCR Screening     Status: None   Collection Time: 01/06/19  8:20 AM   Specimen: Nasal Mucosa; Nasopharyngeal  Result Value Ref Range Status   MRSA by PCR NEGATIVE NEGATIVE Final    Comment:        The GeneXpert MRSA Assay (FDA approved for NASAL specimens only), is one component of a comprehensive MRSA colonization surveillance program. It is not intended to diagnose MRSA infection nor to guide or monitor treatment for MRSA infections. Performed at [facility], [place] [address], [place], [place] [address]   Culture, blood (routine x 2)     Status: None (Preliminary result)   Collection Time: 01/06/19  5:34 PM   Specimen: Right Antecubital; Blood  Result Value Ref Range Status   Specimen Description   Final    RIGHT ANTECUBITAL Performed at [place] [address] [place], [place] [address]    Special Requests   Final    BOTTLES DRAWN AEROBIC ONLY Blood Culture adequate volume Performed at [place] [address], [place], [place] [address]    Culture   Final    NO GROWTH 4 DAYS Performed at [facility], [place] [address] [place], [place] [address]    Report Status PENDING  Incomplete  Culture, blood (routine x 2)     Status: None (Preliminary result)   Collection Time: 01/06/19  5:44 PM   Specimen: Right Antecubital; Blood  Result Value Ref Range Status   Specimen Description   Final    RIGHT ANTECUBITAL Performed at [place] [address], [place], [place] [address]    Special Requests   Final    BOTTLES DRAWN AEROBIC AND ANAEROBIC Blood Culture adequate volume Performed at [place] [address] [place], [place] [address]    Culture   Final    NO GROWTH 4 DAYS Performed at [facility], [place] [address] [place], [place] [address]    Report Status PENDING  Incomplete  Respiratory Panel by PCR     Status: None   Collection Time: 01/07/19 11:30 AM   Specimen: Nasopharyngeal Swab; Respiratory  Result Value Ref Range Status   Adenovirus NOT DETECTED NOT DETECTED Final   Coronavirus 229E NOT DETECTED NOT DETECTED Final    Comment: (NOTE) The Coronavirus on the Respiratory Panel, DOES NOT test for the novel  Coronavirus (2019 nCoV)    Coronavirus HKU1 NOT DETECTED NOT DETECTED Final   Coronavirus NL63 NOT DETECTED NOT DETECTED Final   Coronavirus OC43 NOT DETECTED NOT DETECTED Final   Metapneumovirus NOT DETECTED  NOT DETECTED Final   Rhinovirus / Enterovirus NOT DETECTED NOT DETECTED Final   Influenza A NOT DETECTED NOT DETECTED Final   Influenza B NOT DETECTED NOT DETECTED Final   Parainfluenza Virus  1 NOT DETECTED NOT DETECTED Final   Parainfluenza Virus 2 NOT DETECTED NOT DETECTED Final   Parainfluenza Virus 3 NOT DETECTED NOT DETECTED Final   Parainfluenza Virus 4 NOT DETECTED NOT DETECTED Final   Respiratory Syncytial Virus NOT DETECTED NOT DETECTED Final   Bordetella pertussis NOT DETECTED NOT DETECTED Final   Chlamydophila pneumoniae NOT DETECTED NOT DETECTED Final   Mycoplasma pneumoniae NOT DETECTED NOT DETECTED Final    Comment: Performed at [facility], [place] [address], [place], [place] [address]     Time coordinating discharge in minutes: 55  SIGNED:   Debbe Odea, MD  [facility] 01/10/2019, 3:31 PM Pager   If 7PM-7AM, please contact night-coverage [email or web address] Password [email or web address]

## 2019-01-10 NOTE — Progress Notes (Signed)
Pharmacy Antibiotic Note  Logan Blackwell is a 85 y.o. male admitted on 01/05/2019 with bleeding bilateral LEs. Patient initially placed on Vancomycin and Cefepime for sepsis secondary to cellulitis. Blood cultures from 11/29 with Acinetobacter baumannii, so Cefepime changed to Primaxin. Once sensitivities returned, antibiotics narrowed to Unasyn per ID recommendations. Pharmacy consulted for dosing.   Plan: - Continue Unasyn 3g IV q6h - Plan to treat through 12/13 per ID recommendations   Height: 5\' 7"  (170.2 cm) Weight: 198 lb 13.7 oz (90.2 kg) IBW/kg (Calculated) : 66.1  Temp (24hrs), Avg:98.5 F (36.9 C), Min:97.9 F (36.6 C), Max:99.1 F (37.3 C)  Recent Labs  Lab 01/05/19 2334 01/06/19 0112 01/06/19 0648 01/06/19 0906 01/07/19 0210 01/08/19 0210 01/09/19 0241 01/10/19 0404  WBC 14.5*  --  13.1*  --  8.5 5.6 4.3 7.1  CREATININE 0.95  --  0.87  --  0.68 0.59* 0.69 0.51*  LATICACIDVEN 2.3* 2.0* 1.6 1.8  --   --   --   --     Estimated Creatinine Clearance: 69.7 mL/min (A) (by C-G formula based on SCr of 0.51 mg/dL (L)).    No Known Allergies  Antimicrobials this admission: 11/29 Vanc >> 12/2 11/29 Cefepime >> 11/30 11/30 Primaxin >> 12/2 12/2 Unasyn >>  Microbiology results: 11/29 COVID +  11/29 BCx: 1/2 bottles Acinetobacter baumannii 11/29 UCx: NGF 11/29 MRSA PCR: neg 11/30 repeat BCx: NGTD 12/1 Respiratory panel by PCR: neg   Thank you for allowing pharmacy to be a part of this patient's care.   Lindell Spar, PharmD, BCPS Clinical Pharmacist  01/10/2019 12:21 PM

## 2019-01-10 NOTE — Care Management Important Message (Signed)
Important Message  Patient Details IM Letter given to Rhea Pink SW to present to the Patient Name: Logan Blackwell MRN: [id number] Date of Birth: 03/16/1929   Medicare Important Message Given:  Yes     Kerin Salen 01/10/2019, 12:43 PM

## 2019-01-10 NOTE — Progress Notes (Signed)
Nutrition Follow-up  RD working remotely.   DOCUMENTATION CODES:   Not applicable  INTERVENTION:  - will adjust TF regimen: Osmolite 1.5 @ 70 ml/hr x16 hours/day (1600-0800) with 30 ml prostat once/day and 100 ml free water QID. - this regimen will provide 1780 kcal (82% estimated kcal need), 85 grams protein (81% estimated protein need), and 1253 ml free water.  - continue to encourage PO intakes.    NUTRITION DIAGNOSIS:   Increased nutrient needs related to wound healing, acute illness as evidenced by estimated needs. -ongoing  GOAL:   Patient will meet greater than or equal to 90% of their needs -met with TF regimen and minimal PO intakes.   MONITOR:   PO intake, TF tolerance, Labs, Weight trends, Skin  ASSESSMENT:   85 y.o. male with history of AICD placement, A. fib, DVT, hypothyroidism, hypoalbuminemia, and CAD. He was admitted in July for septic shock. He was found to be COVID-19 positive PTA. He resides at [facility] and was found to have blood-tinged discharge from BLE wounds. He is unable to provide any history. He has a PEG.  Weight has remained stable. Diet changed from Heart Healthy, thin liquids to Dysphagia 1, thin liquids yesterday afternoon. No intakes have been documented throughout hospitalization.  Patient has a PEG and is receiving Osmolite 1.5 @ 50 ml/hr with 30 ml prostat TID and 100 ml water QID. This regimen is providing 2100 kcal, 120 grams protein, and 1314 ml free water.   Able to talk with RN who reports that tech noted that patient ate very little of breakfast. RN reports patient was eating mashed potatoes, [place] loaf, and enjoying magic cup while she was in his room, but that he mainly prefers to drink diet Coke throughout the day rather than eating much at meals. She confirms that PEG remains in place and that patient is receiving TF regimen as outlined above.   Communicated with RN plan to transition to nocturnal TF in an effort to encourage PO  intakes.    Labs reviewed; BUN: 31 mg/dl, creatinine: 0.51 mg/dl, Ca: 7.6 mg/dl. Medications reviewed; 300 mg ferrous sulfate/day, 1 mg folvite/day, 175 mcg oral synthroid/day, 15 ml liquid multivitamin/day, 1000 mcg oral cyanocobalamin/day.     NUTRITION - FOCUSED PHYSICAL EXAM:  unable to complete for COVID positive patient.   Diet Order:   Diet Order            DIET - DYS 1 Room service appropriate? No; Fluid consistency: Thin  Diet effective now              EDUCATION NEEDS:   Not appropriate for education at this time  Skin:  Skin Assessment: Skin Integrity Issues: Skin Integrity Issues:: Stage II Stage II: sacrum  Last BM:  12/4  Height:   Ht Readings from Last 1 Encounters:  01/06/19 '5\' 7"'  (1.702 m)    Weight:   Wt Readings from Last 1 Encounters:  01/10/19 90.2 kg    Ideal Body Weight:  67.3 kg  BMI:  Body mass index is 31.15 kg/m.  Estimated Nutritional Needs:   Kcal:  2952-8413 kcal  Protein:  105-120 grams  Fluid:  >/= 2.2 L/day      Jarome Matin, MS, RD, LDN, [facility] Inpatient Clinical Dietitian Pager # [phone] After hours/weekend pager # [phone]

## 2019-01-10 NOTE — TOC Progression Note (Signed)
Transition of Care [facility]) - Progression Note    Patient Details  Name: Logan Blackwell MRN: [id number] Date of Birth: Jun 14, 1929  Transition of Care [facility]) CM/SW [place], LCSW Phone Number: 01/10/2019, 9:41 AM  Clinical Narrative:   Received approval from [facility] for readmission to [facility]. #[id number], next review date 01/13/19. Will provide information to facility.    Expected Discharge Plan: Skilled Nursing Facility Barriers to Discharge: Other (comment)(medical stability)  Expected Discharge Plan and Services Expected Discharge Plan: [facility] In-house Referral: Clinical Social Work     Living arrangements for the past 2 months: [place]                                       Social Determinants of Health (SDOH) Interventions    Readmission Risk Interventions No flowsheet data found.

## 2019-01-10 NOTE — Progress Notes (Signed)
[place] for Infectious Disease    Date of Admission:  01/05/2019   Total days of antibiotics 5           ID: Logan Blackwell is a 85 y.o. male with  Principal Problem:   Sepsis ([place]) Active Problems:   COVID-19 virus infection   Cellulitis   Pressure injury of skin   A-fib (HCC)   AICD (automatic cardioverter/defibrillator) present   PEG (percutaneous endoscopic gastrostomy) status (HCC)    Subjective:  Remains afebrile. More alert today. Denies fevers - still has some left arm discomfort  Medications:  . aspirin  81 mg Oral Daily  . atorvastatin  40 mg Oral QHS  . chlorhexidine  15 mL Mouth Rinse BID  . Chlorhexidine Gluconate Cloth  6 each Topical Daily  . [START ON 01/11/2019] feeding supplement (PRO-STAT SUGAR FREE 64)  30 mL Per Tube Daily  . ferrous sulfate  300 mg Oral Daily  . folic acid  1 mg Oral Daily  . free water  100 mL Per Tube Q6H  . Gerhardt's butt cream   Topical TID  . heparin  5,000 Units Subcutaneous Q8H  . levothyroxine  175 mcg Oral Q0600  . mouth rinse  15 mL Mouth Rinse q12n4p  . metoprolol tartrate  25 mg Oral BID  . multivitamin  15 mL Oral Daily  . pantoprazole sodium  40 mg Oral Daily  . sertraline  50 mg Oral Daily  . vitamin B-12  1,000 mcg Oral Daily    Objective: Vital signs in last 24 hours: Temp:  [97.9 F (36.6 C)-99.1 F (37.3 C)] 97.9 F (36.6 C) (12/04 0518) Pulse Rate:  [60-66] 60 (12/04 0518) Resp:  [16-20] 20 (12/04 0518) BP: (114-148)/(39-127) 144/127 (12/04 0518) SpO2:  [93 %-95 %] 93 % (12/04 0518) Weight:  [90.2 kg] 90.2 kg (12/04 0500) Physical Exam  Constitutional: He is oriented to person, place, and time. He appears well-developed and well-nourished. No distress.  HENT:  Mouth/Throat: Oropharynx is clear and moist. No oropharyngeal exudate.  Cardiovascular: Normal rate, regular rhythm and normal heart sounds. Exam reveals no gallop and no friction rub.  No murmur heard.  Pulmonary/Chest: Effort  normal and breath sounds normal. No respiratory distress. He has no wheezes.  Abdominal: Soft. Bowel sounds are normal. He exhibits no distension. +PEG. Ext: anasarca, edema to left forearm Neurological: He is alert and oriented to person, place, and time.  Skin: Skin is warm and dry.multiple lesions to arms and torso and head as previously described Psychiatric: He has a normal mood and affect. His behavior is normal.     Lab Results Recent Labs    01/09/19 0241 01/10/19 0404  WBC 4.3 7.1  HGB 8.6* 9.5*  HCT 28.2* 31.3*  NA 142 143  K 4.1 4.2  CL 110 108  CO2 26 25  BUN 36* 31*  CREATININE 0.69 0.51*   Liver Panel Recent Labs    01/09/19 0241 01/10/19 0404  PROT 5.1* 5.5*  ALBUMIN 1.9* 1.9*  AST 32 39  ALT 24 26  ALKPHOS 40 42  BILITOT 0.9 0.5   Sedimentation Rate No results for input(s): ESRSEDRATE in the last 72 hours. C-Reactive Protein Recent Labs    01/09/19 0241 01/10/19 0404  CRP 4.9* 2.9*    Microbiology: reviewed Studies/Results: [place] Ekg Site Rite  Result Date: 01/09/2019 If Site Rite image not attached, placement could not be confirmed due to current cardiac rhythm.    Assessment/Plan: Acinetobacter  bacteremia = plan for 14 day course of tx using 11/30 as day 1  Cellulitis? Of left forearm = consider wrapping to see if swelling improved.   Malnutrition = continue with tube feeds  Underlying skin condition = would benefit from derm opinion/mgmt as outpatient  [facility] for Infectious Diseases Cell: [phone] Pager: [phone]  01/10/2019, 4:18 PM

## 2019-01-11 DIAGNOSIS — R7881 Bacteremia: Secondary | ICD-10-CM

## 2019-01-11 DIAGNOSIS — L03114 Cellulitis of left upper limb: Secondary | ICD-10-CM

## 2019-01-11 LAB — CBC WITH DIFFERENTIAL/PLATELET
Abs Immature Granulocytes: 1.03 10*3/uL — ABNORMAL HIGH (ref 0.00–0.07)
Basophils Absolute: 0 10*3/uL (ref 0.0–0.1)
Basophils Relative: 1 %
Eosinophils Absolute: 0.3 10*3/uL (ref 0.0–0.5)
Eosinophils Relative: 4 %
HCT: 29.6 % — ABNORMAL LOW (ref 39.0–52.0)
Hemoglobin: 8.8 g/dL — ABNORMAL LOW (ref 13.0–17.0)
Immature Granulocytes: 14 %
Lymphocytes Relative: 23 %
Lymphs Abs: 1.7 10*3/uL (ref 0.7–4.0)
MCH: 29.2 pg (ref 26.0–34.0)
MCHC: 29.7 g/dL — ABNORMAL LOW (ref 30.0–36.0)
MCV: 98.3 fL (ref 80.0–100.0)
Monocytes Absolute: 0.8 10*3/uL (ref 0.1–1.0)
Monocytes Relative: 11 %
Neutro Abs: 3.7 10*3/uL (ref 1.7–7.7)
Neutrophils Relative %: 47 %
Platelets: 352 10*3/uL (ref 150–400)
RBC: 3.01 MIL/uL — ABNORMAL LOW (ref 4.22–5.81)
RDW: 17.1 % — ABNORMAL HIGH (ref 11.5–15.5)
WBC: 7.6 10*3/uL (ref 4.0–10.5)
nRBC: 0 % (ref 0.0–0.2)

## 2019-01-11 LAB — GLUCOSE, CAPILLARY
Glucose-Capillary: 108 mg/dL — ABNORMAL HIGH (ref 70–99)
Glucose-Capillary: 132 mg/dL — ABNORMAL HIGH (ref 70–99)
Glucose-Capillary: 84 mg/dL (ref 70–99)
Glucose-Capillary: 87 mg/dL (ref 70–99)

## 2019-01-11 LAB — CULTURE, BLOOD (ROUTINE X 2)
Culture: NO GROWTH
Culture: NO GROWTH
Special Requests: ADEQUATE
Special Requests: ADEQUATE

## 2019-01-11 LAB — COMPREHENSIVE METABOLIC PANEL
ALT: 25 U/L (ref 0–44)
AST: 37 U/L (ref 15–41)
Albumin: 1.8 g/dL — ABNORMAL LOW (ref 3.5–5.0)
Alkaline Phosphatase: 46 U/L (ref 38–126)
Anion gap: 8 (ref 5–15)
BUN: 24 mg/dL — ABNORMAL HIGH (ref 8–23)
CO2: 27 mmol/L (ref 22–32)
Calcium: 7.3 mg/dL — ABNORMAL LOW (ref 8.9–10.3)
Chloride: 107 mmol/L (ref 98–111)
Creatinine, Ser: 0.53 mg/dL — ABNORMAL LOW (ref 0.61–1.24)
GFR calc Af Amer: >60 mL/min (ref >60)
GFR calc non Af Amer: >60 mL/min (ref >60)
Glucose, Bld: 122 mg/dL — ABNORMAL HIGH (ref 70–99)
Potassium: 4.5 mmol/L (ref 3.5–5.1)
Sodium: 142 mmol/L (ref 135–145)
Total Bilirubin: <0.1 mg/dL — ABNORMAL LOW (ref 0.3–1.2)
Total Protein: 4.8 g/dL — ABNORMAL LOW (ref 6.5–8.1)

## 2019-01-11 LAB — FERRITIN: Ferritin: 1673 ng/mL — ABNORMAL HIGH (ref 24–336)

## 2019-01-11 LAB — MAGNESIUM: Magnesium: 2.1 mg/dL (ref 1.7–2.4)

## 2019-01-11 LAB — D-DIMER, QUANTITATIVE: D-Dimer, Quant: 1.85 ug/mL-FEU — ABNORMAL HIGH (ref 0.00–0.50)

## 2019-01-11 LAB — C-REACTIVE PROTEIN: CRP: 1.4 mg/dL — ABNORMAL HIGH (ref <1.0)

## 2019-01-11 LAB — PHOSPHORUS: Phosphorus: 1.9 mg/dL — ABNORMAL LOW (ref 2.5–4.6)

## 2019-01-11 MED ORDER — HEPARIN SOD (PORK) LOCK FLUSH 100 UNIT/ML IV SOLN
250.0000 [IU] | INTRAVENOUS | Status: AC | PRN
  Administered 2019-01-11: 250 [IU]
  Filled 2019-01-11: qty 2.5

## 2019-01-11 NOTE — Discharge Summary (Signed)
Physician Discharge Summary  Logan Blackwell BPZ:[id number] DOB: May 06, 1929 DOA: 01/05/2019  PCP: Patient, No Pcp Per  Admit date: 01/05/2019 Discharge date: 01/11/2019  Admitted From: SNF Disposition:  SNF   Recommendations for Outpatient Follow-up:  1. F/u on left arm edema 2. Unasyn to d/c on 12/3   Discharge Condition:  stable   CODE STATUS:  DNF   Diet recommendation:  Pureed diet with PEG feeds and no more than 400 cc of Free water Consultations:  ID    Discharge Diagnoses:  Principal Problem:   Sepsis   Active Problems:   Cellulitis of left arm   COVID-19 virus infection Acute metabolic encephalopathy Acinetobacter bacteremia   Pressure injury of skin   A-fib (HCC)   AICD (automatic cardioverter/defibrillator) present   PEG (percutaneous endoscopic gastrostomy) status ([place])     Brief Summary: Logan Blackwell is a 85 y.o.malewho resides in a SNF withhistory of AICD placement, A. fib, DVT hypothyroidism, chronic back pain, CAD,  H/o of hemarthrosis admitted from July to Aug for septic shock and subsequently developed cognitive impairment, received a PEG tube (in Aug). He recently diagnosed with Covid 19 infection and because of this he was transferred to a SNF in [place] from [facility] rehab in [place]. He was found to have blood-tinged discharge from his wounds on  lower extremity and was transferred to [facility]. Patient is not able to contribute anything to the history.    CXR in ED> Bibasilar atelectasis or infiltrates. Possible small left effusion versus pleural thickening.  Hospital Course:  Principal Problem:   Severe Sepsis with tachycardia, leukocytosis, lactic acidosis and elevated procalcitonin - suspected to be due to left arm, cellulitis, bacteremia and also COVID 19 - on exam, noted to have extensive erythema and edema of his left arm - CT of this arm suggestive of cellulitis and myofasciitis - Venous duplex was not an adequate  study due to pain but no DVT noted - 1 set of blood cultures from admission subsequently found to be + with gram neg rods-  BCID > Acinetobacter baumanii  - has numerous skin tears and wounds which could have lead to the cellulitis and subsequent bacteremia - I have asked for an ID opinion - At this time, recommendations from ID are to continue Anitbitoics (Unasyn) for a total of 14 days using 11/30 as day one- end dated 12/13. - discussed PICC line with son, Francee Piccolo. He is in agreement with this.  Active Problems:     COVID-19 virus infection - asymptomatic- no hypoxia - According to his son, he tested positive on 11/29 and because of testing positive, he was transferred to a SNF in [place] - initial CXR suggestive of lung infiltrates but repeat is more consistent with atelectasis - Inflammatory marker improving    Hypokalemia, Hypophosphatemia - replaced     A-fib  - not on anticoagulation as outpt - cont Metoprolol  AICD (automatic cardioverter/defibrillator) present  Chronic diastolic CHF- dehydration - he receives Lasix as outpt which is currently on hold due to dehydration as evidenced by physical exam, hyponatremia and elevated BUN/Cr ratio  - I have changed Lasix to PRN and feel that if his free water is limited, he does not need daily Lasix  Nutrition/ PEG status  - tube feeds ordered based on dietician recommendations > Osmolite 1.5 at 70cc/hr which will provide 1780 kcal (82% estimated kcal need), 85 grams protein (81% estimated protein need), and 1253 ml free water.  - 100 cc free  water QID also ordered - of note, the patient does eat a pureed foods but not enough to maintain adequate nutrition- an SLP eval was done and is was recommended to maintain him on a pureed diet  Acute metabolic encephalopathy superimposed on baseline cognitive issues - likely due to acute illness- appears to be improving-  Hypothyroid - cont Synthroid  Anemia -  anemia  panel  GERD? -cont Protonix  Extensive wounds and skin tears  - I have asked for wound care to assist with managing these-   Rash on scalp - recommend derm consult as outpt  Bed bound with PEG and Cognitive impairment - since last admission in July/Aug for septic shock  Code Status - DNR per both sons    Discharge Exam: Vitals:   01/10/19 2126 01/11/19 0525  BP: (!) 152/65 (!) 167/61  Pulse: (!) 59 (!) 59  Resp: 18 20  Temp: 97.8 F (36.6 C) 97.8 F (36.6 C)  SpO2: 93% 93%   Vitals:   01/10/19 1349 01/10/19 2126 01/11/19 0500 01/11/19 0525  BP: (!) 145/100 (!) 152/65  (!) 167/61  Pulse: 66 (!) 59  (!) 59  Resp: '20 18  20  ' Temp: 98.6 F (37 C) 97.8 F (36.6 C)  97.8 F (36.6 C)  TempSrc: Axillary Oral  Oral  SpO2: 94% 93%  93%  Weight:   90.3 kg   Height:        General: Pt is alert, awake, not in acute distress Cardiovascular: RRR, S1/S2 +, no rubs, no gallops Respiratory: CTA bilaterally, no wheezing, no rhonchi Abdominal: Soft, NT, ND, bowel sounds + Extremities: no edema, no cyanosis   Discharge Instructions  Discharge Instructions    Diet - low sodium heart healthy   Complete by: As directed    Home infusion instructions [facility] May follow [place] Dosing Protocol; May administer Cathflo as needed to maintain patency of vascular access device.; Flushing of vascular access device: per [facility] Protocol: 0.9% NaCl pre/post medica...   Complete by: As directed    Instructions: May follow [place] Dosing Protocol   Instructions: May administer Cathflo as needed to maintain patency of vascular access device.   Instructions: Flushing of vascular access device: per [facility] Protocol: 0.9% NaCl pre/post medication administration and prn patency; Heparin 100 u/ml, 30m for implanted ports and Heparin 10u/ml, 528mfor all other central venous catheters.   Instructions: May follow [facility] Anaphylaxis Protocol for First Dose Administration in the home:  0.9% NaCl at 25-50 ml/hr to maintain IV access for protocol meds. Epinephrine 0.3 ml IV/IM PRN and Benadryl 25-50 IV/IM PRN s/s of anaphylaxis.   Instructions: [facility] Coordinator (RN) to assist per patient IV care needs in the home PRN.   Increase activity slowly   Complete by: As directed    Increase activity slowly   Complete by: As directed      Allergies as of 01/11/2019   No Known Allergies     Medication List    TAKE these medications   acetaminophen 325 MG tablet Commonly known as: TYLENOL Take 650 mg by mouth every 8 (eight) hours as needed for mild pain, moderate pain, fever or headache.   ampicillin-sulbactam  IVPB Commonly known as: UNASYN Inject 3 g into the vein every 6 (six) hours for 9 days. Indication: Acinetobacter baumannii bacteremia  Last Day of Therapy: 01/19/2019 Labs - Once weekly:  CBC/D and BMP, Labs - Every other week:  ESR and CRP  aspirin 81 MG chewable tablet Chew 81 mg by mouth daily.   atorvastatin 40 MG tablet Commonly known as: LIPITOR Take 40 mg by mouth at bedtime.   emollient cream Commonly known as: BIAFINE Apply 1 application topically daily.   feeding supplement (OSMOLITE 1.5 CAL) Liqd Place 1,000 mLs into feeding tube continuous.   feeding supplement (PRO-STAT SUGAR FREE 64) Liqd Take 30 mLs by mouth 3 (three) times daily with meals. What changed: Another medication with the same name was added. Make sure you understand how and when to take each.   feeding supplement (PRO-STAT SUGAR FREE 64) Liqd Place 30 mLs into feeding tube daily. What changed: You were already taking a medication with the same name, and this prescription was added. Make sure you understand how and when to take each.   Ferrous Sulfate 220 (44 Fe) MG/5ML Liqd Take 5 mLs by mouth daily.   folic acid 1 MG tablet Commonly known as: FOLVITE Take 1 mg by mouth daily.   free water Soln Place 100 mLs into feeding tube every 6 (six)  hours.   furosemide 20 MG tablet Commonly known as: LASIX Take 1 tablet (20 mg total) by mouth daily as needed. PRN weight gain of 2 lb in 48 hrs or 3 lbs in 1 wk or edema What changed:   medication strength  how much to take  when to take this  reasons to take this  additional instructions   ketoconazole 2 % cream Commonly known as: NIZORAL Apply 1 application topically 2 (two) times daily.   levothyroxine 175 MCG tablet Commonly known as: SYNTHROID Take 175 mcg by mouth daily before breakfast.   Melatonin 3 MG Caps Take 3 mg by mouth at bedtime.   metoprolol tartrate 25 MG tablet Commonly known as: LOPRESSOR Take 25 mg by mouth 2 (two) times daily.   multivitamin Liqd Take 5 mLs by mouth daily.   potassium chloride 20 MEQ/15ML (10%) Soln Take 20 mEq by mouth daily.   Protonix 40 mg/20 mL Pack Generic drug: pantoprazole sodium Take 40 mg by mouth daily.   sertraline 50 MG tablet Commonly known as: ZOLOFT Take 50 mg by mouth daily.   vitamin B-12 1000 MCG tablet Commonly known as: CYANOCOBALAMIN Take 1,000 mcg by mouth daily.            Home Infusion Instuctions  (From admission, onward)         Start     Ordered   01/10/19 0000  Home infusion instructions [facility] May follow [place] Dosing Protocol; May administer Cathflo as needed to maintain patency of vascular access device.; Flushing of vascular access device: per [facility] Protocol: 0.9% NaCl pre/post medica...    Question Answer Comment  Instructions May follow [place] Dosing Protocol   Instructions May administer Cathflo as needed to maintain patency of vascular access device.   Instructions Flushing of vascular access device: per [facility] Protocol: 0.9% NaCl pre/post medication administration and prn patency; Heparin 100 u/ml, 23m for implanted ports and Heparin 10u/ml, 532mfor all other central venous catheters.   Instructions May follow [facility] Anaphylaxis Protocol for First Dose  Administration in the home: 0.9% NaCl at 25-50 ml/hr to maintain IV access for protocol meds. Epinephrine 0.3 ml IV/IM PRN and Benadryl 25-50 IV/IM PRN s/s of anaphylaxis.   Instructions [facility] Infusion Coordinator (RN) to assist per patient IV care needs in the home PRN.      01/10/19 1528  Contact information for after-discharge care    [facility] .   Service: Skilled Nursing Contact information: [place] [phone]             No Known Allergies   Procedures/Studies: Procedures:  2 D ECHO 1. Left ventricular ejection fraction, by visual estimation, is 50 to 55%. The left ventricle has normal function. There is severely increased left ventricular hypertrophy. 2. Elevated left atrial pressure. 3. Indeterminate diastolic filling due to E-A fusion. 4. Poor acoustic windows limit study. 5. Global right ventricle has normal systolic function.The right ventricular size is normal. No increase in right ventricular wall thickness. 6. Left atrial size was normal. 7. Right atrial size was normal. 8. The mitral valve is grossly normal. Mild mitral valve regurgitation. 9. The tricuspid valve is normal in structure. Tricuspid valve regurgitation is mild. 10. The aortic valve is tricuspid. Aortic valve regurgitation is mild. Mild aortic valve stenosis. 11. The pulmonic valve was grossly normal. Pulmonic valve regurgitation is mild. 12. Mildly elevated pulmonary artery systolic pressure.  Dg Tibia/fibula Left  Result Date: 01/06/2019 CLINICAL DATA:  Bleeding wounds EXAM: LEFT TIBIA AND FIBULA - 2 VIEW COMPARISON:  None. FINDINGS: Diffuse vascular calcifications. No acute bony abnormality. Specifically, no fracture, subluxation, or dislocation. IMPRESSION: No acute bony abnormality. Electronically Signed   By: Rolm Baptise M.D.   On: 01/06/2019 01:17   Dg Tibia/fibula Right  Result Date:  01/06/2019 CLINICAL DATA:  Bleeding wounds EXAM: RIGHT TIBIA AND FIBULA - 2 VIEW COMPARISON:  None. FINDINGS: Diffuse vascular calcifications. No acute bony abnormality. Specifically, no fracture, subluxation, or dislocation. IMPRESSION: No acute bony abnormality. Electronically Signed   By: Rolm Baptise M.D.   On: 01/06/2019 01:16   Dg Chest Port 1 View  Result Date: 01/07/2019 CLINICAL DATA:  Septic, weakness. EXAM: PORTABLE CHEST 1 VIEW COMPARISON:  Chest x-ray dated 01/06/2019. FINDINGS: Stable cardiomegaly. Overall cardiomediastinal silhouette appears stable in size and configuration. LEFT chest wall pacemaker/ICD apparatus appears stable. Probable mild bibasilar atelectasis and/or small pleural effusions. Lungs otherwise clear. No pneumothorax seen. Aortic atherosclerosis. IMPRESSION: 1. Stable cardiomegaly. 2. Probable mild bibasilar atelectasis and/or small pleural effusions. 3. Aortic atherosclerosis. 4. No new lung findings. Electronically Signed   By: Franki Cabot M.D.   On: 01/07/2019 11:18   Dg Chest Port 1 View  Result Date: 01/06/2019 CLINICAL DATA:  COVID positive EXAM: PORTABLE CHEST 1 VIEW COMPARISON:  None. FINDINGS: Left pacer in place with leads in the right atrium and right ventricle. Aortic calcifications. Low lung volumes. Right infrahilar atelectasis or infiltrate. Left base atelectasis and possible small left effusion versus pleural thickening. Left hilar or infrahilar calcification compatible with old granulomatous disease. IMPRESSION: Bibasilar atelectasis or infiltrates. Possible small left effusion versus pleural thickening. Electronically Signed   By: Rolm Baptise M.D.   On: 01/06/2019 01:18   Vas [place] Upper Extremity Venous Duplex  Result Date: 01/08/2019 UPPER VENOUS STUDY  Indications: Pain, Swelling, and cellulitis and myofascitis of the left upper extremity Limitations: Poor ultrasound/tissue interface, body habitus and patient in significant pain. Restricted  mobility. Comparison Study: No prior study. Performing Technologist: Maudry Mayhew MHA, RDMS, RVT, RDCS  Examination Guidelines: A complete evaluation includes B-mode imaging, spectral Doppler, color Doppler, and power Doppler as needed of all accessible portions of each vessel. Bilateral testing is considered an integral part of a complete examination. Limited examinations for reoccurring indications may be performed as noted.  Left  Findings: +----------+------------+---------+-----------+----------+-------+ LEFT      CompressiblePhasicitySpontaneousPropertiesSummary +----------+------------+---------+-----------+----------+-------+ IJV           Full       Yes       Yes                      +----------+------------+---------+-----------+----------+-------+ Subclavian               Yes       Yes                      +----------+------------+---------+-----------+----------+-------+ Axillary                 Yes       Yes                      +----------+------------+---------+-----------+----------+-------+ Brachial                 Yes       Yes                      +----------+------------+---------+-----------+----------+-------+ Radial                   Yes       Yes                      +----------+------------+---------+-----------+----------+-------+ Ulnar                    Yes       Yes                      +----------+------------+---------+-----------+----------+-------+ Cephalic      Full                                          +----------+------------+---------+-----------+----------+-------+ Basilic       Full                                          +----------+------------+---------+-----------+----------+-------+  Summary:  Left: Many compression maneuvers not performed secondary to pain. No evidence of acute DVT involving the left upper extremity by color and pulsed wave Doppler.  *See table(s) above for measurements and  observations.  Diagnosing physician: Harold Barban MD Electronically signed by Harold Barban MD on 01/08/2019 at 2:42:40 PM.    Final    [place] Ekg Site Rite  Result Date: 01/09/2019 If Site Rite image not attached, placement could not be confirmed due to current cardiac rhythm.  Ct Extrem Up Entire Arm L W/cm  Result Date: 01/06/2019 CLINICAL DATA:  Left upper extremity pain and swelling. EXAM: CT OF THE UPPER LEFT EXTREMITY WITH CONTRAST TECHNIQUE: Multidetector CT imaging of the upper left extremity was performed according to the standard protocol following intravenous contrast administration. CONTRAST:  145m OMNIPAQUE IOHEXOL 300 MG/ML  SOLN COMPARISON:  None. FINDINGS: Diffuse subcutaneous soft tissue swelling/edema/fluid most notably involving the forearm area. No discrete rim enhancing fluid collection to suggest a drainable soft tissue abscess. Suspect myofasciitis involving the forearm musculature without obvious changes of pyomyositis. Degenerative changes are noted at the shoulder, elbow, wrist and hand and hip joints. No obvious destructive bony changes to suggest osteomyelitis.  No obvious CT findings for septic arthritis. Severe rotator cuff disease. Advanced vascular calcifications. Incidental note is made of a small left pleural effusion and overlying atelectasis. Calcified mediastinal lymph nodes are noted. IMPRESSION: 1. Cellulitis and myofasciitis involving the left forearm in particular but no definite CT findings for discrete drainable subcutaneous abscess or pyomyositis. 2. Advanced degenerative changes involving all of the joints but no obvious CT findings to suggest septic arthritis or osteomyelitis. 3. Severe/advanced vascular disease. 4. Small left pleural effusion and overlying atelectasis. Electronically Signed   By: Marijo Sanes M.D.   On: 01/06/2019 06:37     The results of significant diagnostics from this hospitalization (including imaging, microbiology, ancillary and  laboratory) are listed below for reference.     Microbiology: Recent Results (from the past 240 hour(s))  Blood Culture (routine x 2)     Status: Abnormal   Collection Time: 01/05/19 11:34 PM   Specimen: BLOOD  Result Value Ref Range Status   Specimen Description   Final    BLOOD LEFT ANTECUBITAL Performed at [facility] [address] [place], [place] [address]    Special Requests   Final    BOTTLES DRAWN AEROBIC AND ANAEROBIC Blood Culture adequate volume Performed at [facility] [address] [place], [place] [address]    Culture  Setup Time   Final    GRAM NEGATIVE RODS AEROBIC BOTTLE ONLY CRITICAL RESULT CALLED TO, READ BACK BY AND VERIFIED WITH: Melodye Ped PharmD 15:50 01/06/19 ([email or web address]) Performed at [facility], [place] [address] [place], [place] [address]    Culture ACINETOBACTER BAUMANNII (A)  Final   Report Status 01/08/2019 FINAL  Final   Organism ID, Bacteria ACINETOBACTER BAUMANNII  Final      Susceptibility   Acinetobacter baumannii - MIC*    CEFTAZIDIME 4 SENSITIVE Sensitive     CEFTRIAXONE 16 INTERMEDIATE Intermediate     CIPROFLOXACIN <=0.25 SENSITIVE Sensitive     GENTAMICIN <=1 SENSITIVE Sensitive     IMIPENEM <=0.25 SENSITIVE Sensitive     PIP/TAZO <=4 SENSITIVE Sensitive     TRIMETH/SULFA <=20 SENSITIVE Sensitive     CEFEPIME 2 SENSITIVE Sensitive     AMPICILLIN/SULBACTAM <=2 SENSITIVE Sensitive     * ACINETOBACTER BAUMANNII  Urine culture     Status: None   Collection Time: 01/05/19 11:34 PM   Specimen: Urine, Catheterized  Result Value Ref Range Status   Specimen Description   Final    URINE, CATHETERIZED Performed at [facility], [place] [address] [place], [place] [address]    Special Requests   Final    NONE Performed at [facility], [place] [address], [place], [place] [address]    Culture   Final    NO GROWTH Performed at [facility], [place]  [address], [place], [place] [address]    Report Status 01/06/2019 FINAL  Final  Blood Culture ID Panel (Reflexed)     Status: Abnormal   Collection Time: 01/05/19 11:34 PM  Result Value Ref Range Status   Enterococcus species NOT DETECTED NOT DETECTED Final   Listeria monocytogenes NOT DETECTED NOT DETECTED Final   Staphylococcus species NOT DETECTED NOT DETECTED Final   Staphylococcus aureus (BCID) NOT DETECTED NOT DETECTED Final   Streptococcus species NOT DETECTED NOT DETECTED Final   Streptococcus agalactiae NOT DETECTED NOT DETECTED Final   Streptococcus pneumoniae NOT DETECTED NOT DETECTED Final   Streptococcus pyogenes NOT DETECTED NOT DETECTED Final  Acinetobacter baumannii DETECTED (A) NOT DETECTED Final    Comment: CRITICAL RESULT CALLED TO, READ BACK BY AND VERIFIED WITH: Melodye Ped PharmD 15:50 01/06/19 ([email or web address])    Enterobacteriaceae species NOT DETECTED NOT DETECTED Final   Enterobacter cloacae complex NOT DETECTED NOT DETECTED Final   Escherichia coli NOT DETECTED NOT DETECTED Final   Klebsiella oxytoca NOT DETECTED NOT DETECTED Final   Klebsiella pneumoniae NOT DETECTED NOT DETECTED Final   Proteus species NOT DETECTED NOT DETECTED Final   Serratia marcescens NOT DETECTED NOT DETECTED Final   Carbapenem resistance NOT DETECTED NOT DETECTED Final   Haemophilus influenzae NOT DETECTED NOT DETECTED Final   Neisseria meningitidis NOT DETECTED NOT DETECTED Final   Pseudomonas aeruginosa NOT DETECTED NOT DETECTED Final   Candida albicans NOT DETECTED NOT DETECTED Final   Candida glabrata NOT DETECTED NOT DETECTED Final   Candida krusei NOT DETECTED NOT DETECTED Final   Candida parapsilosis NOT DETECTED NOT DETECTED Final   Candida tropicalis NOT DETECTED NOT DETECTED Final    Comment: Performed at [facility], [place] [address], [place], [place] [address]  MRSA PCR Screening     Status: None   Collection Time: 01/06/19  8:20 AM   Specimen: Nasal Mucosa; Nasopharyngeal   Result Value Ref Range Status   MRSA by PCR NEGATIVE NEGATIVE Final    Comment:        The GeneXpert MRSA Assay (FDA approved for NASAL specimens only), is one component of a comprehensive MRSA colonization surveillance program. It is not intended to diagnose MRSA infection nor to guide or monitor treatment for MRSA infections. Performed at [facility], [place] [address], [place], [place] [address]   Culture, blood (routine x 2)     Status: None   Collection Time: 01/06/19  5:34 PM   Specimen: Right Antecubital; Blood  Result Value Ref Range Status   Specimen Description   Final    RIGHT ANTECUBITAL Performed at [facility] [address] [place], [place] [address]    Special Requests   Final    BOTTLES DRAWN AEROBIC ONLY Blood Culture adequate volume Performed at [facility] [address], [place], [place] [address]    Culture   Final    NO GROWTH 5 DAYS Performed at [facility], [place] [address] [place], [place] [address]    Report Status 01/11/2019 FINAL  Final  Culture, blood (routine x 2)     Status: None   Collection Time: 01/06/19  5:44 PM   Specimen: Right Antecubital; Blood  Result Value Ref Range Status   Specimen Description   Final    RIGHT ANTECUBITAL Performed at [facility] [address], [place], [place] [address]    Special Requests   Final    BOTTLES DRAWN AEROBIC AND ANAEROBIC Blood Culture adequate volume Performed at [facility] [address], [place], [place] [address]    Culture   Final    NO GROWTH 5 DAYS Performed at [facility], [place] [address] [place],  [address]    Report Status 01/11/2019 FINAL  Final  Respiratory Panel by PCR     Status: None   Collection Time: 01/07/19 11:30 AM   Specimen: Nasopharyngeal Swab; Respiratory  Result Value Ref Range Status   Adenovirus NOT DETECTED NOT DETECTED Final    Coronavirus 229E NOT DETECTED NOT DETECTED Final    Comment: (NOTE) The Coronavirus on the Respiratory  Panel, DOES NOT test for the novel  Coronavirus (2019 nCoV)    Coronavirus HKU1 NOT DETECTED NOT DETECTED Final   Coronavirus NL63 NOT DETECTED NOT DETECTED Final   Coronavirus OC43 NOT DETECTED NOT DETECTED Final   Metapneumovirus NOT DETECTED NOT DETECTED Final   Rhinovirus / Enterovirus NOT DETECTED NOT DETECTED Final   Influenza A NOT DETECTED NOT DETECTED Final   Influenza B NOT DETECTED NOT DETECTED Final   Parainfluenza Virus 1 NOT DETECTED NOT DETECTED Final   Parainfluenza Virus 2 NOT DETECTED NOT DETECTED Final   Parainfluenza Virus 3 NOT DETECTED NOT DETECTED Final   Parainfluenza Virus 4 NOT DETECTED NOT DETECTED Final   Respiratory Syncytial Virus NOT DETECTED NOT DETECTED Final   Bordetella pertussis NOT DETECTED NOT DETECTED Final   Chlamydophila pneumoniae NOT DETECTED NOT DETECTED Final   Mycoplasma pneumoniae NOT DETECTED NOT DETECTED Final    Comment: Performed at [facility], [place] [address] [address], [place], [place] [address]     Labs: BNP (last 3 results) No results for input(s): BNP in the last 8760 hours. Basic Metabolic Panel: Recent Labs  Lab 01/07/19 0210 01/08/19 0210 01/09/19 0241 01/10/19 0404 01/11/19 0344  NA 140 144 142 143 142  K 2.9* 4.3 4.1 4.2 4.5  CL 106 112* 110 108 107  CO2 '25 23 26 25 27  ' GLUCOSE 131* 104* 128* 105* 122*  BUN 45* 39* 36* 31* 24*  CREATININE 0.68 0.59* 0.69 0.51* 0.53*  CALCIUM 7.7* 7.8* 7.3* 7.6* 7.3*  MG 2.0 2.1 2.2 2.3 2.1  PHOS 2.2* 1.4* 2.1* 2.1* 1.9*   Liver Function Tests: Recent Labs  Lab 01/07/19 0210 01/08/19 0210 01/09/19 0241 01/10/19 0404 01/11/19 0344  AST 42* 35 32 39 37  ALT '23 25 24 26 25  ' ALKPHOS 44 45 40 42 46  BILITOT 0.7 0.7 0.9 0.5 <0.1*  PROT 5.6* 5.7* 5.1* 5.5* 4.8*  ALBUMIN 2.0* 2.0* 1.9* 1.9* 1.8*   No results for input(s): LIPASE, AMYLASE in the last 168 hours. No results  for input(s): AMMONIA in the last 168 hours. CBC: Recent Labs  Lab 01/07/19 0210 01/08/19 0210 01/09/19 0241 01/10/19 0404 01/11/19 0344  WBC 8.5 5.6 4.3 7.1 7.6  NEUTROABS 7.3 4.0 2.2 3.8 3.7  HGB 9.5* 8.9* 8.6* 9.5* 8.8*  HCT 30.7* 29.0* 28.2* 31.3* 29.6*  MCV 98.4 98.0 98.3 99.7 98.3  PLT 261 342 268 347 352   Cardiac Enzymes: No results for input(s): CKTOTAL, CKMB, CKMBINDEX, TROPONINI in the last 168 hours. BNP: Invalid input(s): POCBNP CBG: Recent Labs  Lab 01/10/19 2130 01/11/19 0114 01/11/19 0519 01/11/19 0936 01/11/19 1212  GLUCAP 120* 108* 132* 84 87   D-Dimer Recent Labs    01/10/19 0404 01/11/19 0344  DDIMER 1.72* 1.85*   Hgb A1c No results for input(s): HGBA1C in the last 72 hours. Lipid Profile No results for input(s): CHOL, HDL, LDLCALC, TRIG, CHOLHDL, LDLDIRECT in the last 72 hours. Thyroid function studies No results for input(s): TSH, T4TOTAL, T3FREE, THYROIDAB in the last 72 hours.  Invalid input(s): FREET3 Anemia work up Recent Labs    01/10/19 0404 01/11/19 0344  FERRITIN 1,324* 1,673*   Urinalysis    Component Value Date/Time   COLORURINE YELLOW 01/05/2019 2334   APPEARANCEUR HAZY (A) 01/05/2019 2334   LABSPEC 1.015 01/05/2019 2334   PHURINE 5.0 01/05/2019 [place] 01/05/2019 2334   HGBUR MODERATE (A) 01/05/2019 2334   [place] 01/05/2019 2334   KETONESUR NEGATIVE  01/05/2019 [place] 01/05/2019 2334   NITRITE NEGATIVE 01/05/2019 2334   LEUKOCYTESUR NEGATIVE 01/05/2019 2334   Sepsis Labs Invalid input(s): PROCALCITONIN,  WBC,  LACTICIDVEN Microbiology Recent Results (from the past 240 hour(s))  Blood Culture (routine x 2)     Status: Abnormal   Collection Time: 01/05/19 11:34 PM   Specimen: BLOOD  Result Value Ref Range Status   Specimen Description   Final    BLOOD LEFT ANTECUBITAL Performed at [facility] [address] [place], [place] [address]     Special Requests   Final    BOTTLES DRAWN AEROBIC AND ANAEROBIC Blood Culture adequate volume Performed at [facility] [address] [place], [place] [address]    Culture  Setup Time   Final    GRAM NEGATIVE RODS AEROBIC BOTTLE ONLY CRITICAL RESULT CALLED TO, READ BACK BY AND VERIFIED WITH: Melodye Ped PharmD 15:50 01/06/19 ([email or web address]) Performed at [facility], [place] [address], [place], [place] [address]    Culture ACINETOBACTER BAUMANNII (A)  Final   Report Status 01/08/2019 FINAL  Final   Organism ID, Bacteria ACINETOBACTER BAUMANNII  Final      Susceptibility   Acinetobacter baumannii - MIC*    CEFTAZIDIME 4 SENSITIVE Sensitive     CEFTRIAXONE 16 INTERMEDIATE Intermediate     CIPROFLOXACIN <=0.25 SENSITIVE Sensitive     GENTAMICIN <=1 SENSITIVE Sensitive     IMIPENEM <=0.25 SENSITIVE Sensitive     PIP/TAZO <=4 SENSITIVE Sensitive     TRIMETH/SULFA <=20 SENSITIVE Sensitive     CEFEPIME 2 SENSITIVE Sensitive     AMPICILLIN/SULBACTAM <=2 SENSITIVE Sensitive     * ACINETOBACTER BAUMANNII  Urine culture     Status: None   Collection Time: 01/05/19 11:34 PM   Specimen: Urine, Catheterized  Result Value Ref Range Status   Specimen Description   Final    URINE, CATHETERIZED Performed at [facility], [place] [address] [place], [place] [address]    Special Requests   Final    NONE Performed at [facility], [place] [address], [place], [place] [address]    Culture   Final    NO GROWTH Performed at [facility], [place] [address] [place], [place] [address]    Report Status 01/06/2019 FINAL  Final  Blood Culture ID Panel (Reflexed)     Status: Abnormal   Collection Time: 01/05/19 11:34 PM  Result Value Ref Range Status   Enterococcus species NOT DETECTED NOT DETECTED Final   Listeria monocytogenes NOT DETECTED NOT DETECTED Final   Staphylococcus species NOT DETECTED NOT DETECTED Final   Staphylococcus aureus (BCID)  NOT DETECTED NOT DETECTED Final   Streptococcus species NOT DETECTED NOT DETECTED Final   Streptococcus agalactiae NOT DETECTED NOT DETECTED Final   Streptococcus pneumoniae NOT DETECTED NOT DETECTED Final   Streptococcus pyogenes NOT DETECTED NOT DETECTED Final   Acinetobacter baumannii DETECTED (A) NOT DETECTED Final    Comment: CRITICAL RESULT CALLED TO, READ BACK BY AND VERIFIED WITH: Melodye Ped PharmD 15:50 01/06/19 ([email or web address])    Enterobacteriaceae species NOT DETECTED NOT DETECTED Final   Enterobacter cloacae complex NOT DETECTED NOT DETECTED Final   Escherichia coli NOT DETECTED NOT DETECTED Final   Klebsiella oxytoca NOT DETECTED NOT DETECTED Final   Klebsiella pneumoniae NOT DETECTED NOT DETECTED Final   Proteus species NOT DETECTED NOT DETECTED Final   Serratia marcescens NOT DETECTED NOT DETECTED Final   Carbapenem resistance  NOT DETECTED NOT DETECTED Final   Haemophilus influenzae NOT DETECTED NOT DETECTED Final   Neisseria meningitidis NOT DETECTED NOT DETECTED Final   Pseudomonas aeruginosa NOT DETECTED NOT DETECTED Final   Candida albicans NOT DETECTED NOT DETECTED Final   Candida glabrata NOT DETECTED NOT DETECTED Final   Candida krusei NOT DETECTED NOT DETECTED Final   Candida parapsilosis NOT DETECTED NOT DETECTED Final   Candida tropicalis NOT DETECTED NOT DETECTED Final    Comment: Performed at [facility], [place] [address], [place], [place] [address]  MRSA PCR Screening     Status: None   Collection Time: 01/06/19  8:20 AM   Specimen: Nasal Mucosa; Nasopharyngeal  Result Value Ref Range Status   MRSA by PCR NEGATIVE NEGATIVE Final    Comment:        The GeneXpert MRSA Assay (FDA approved for NASAL specimens only), is one component of a comprehensive MRSA colonization surveillance program. It is not intended to diagnose MRSA infection nor to guide or monitor treatment for MRSA infections. Performed at [facility], [place] [address] [place], [place] [address]   Culture, blood (routine x 2)     Status: None   Collection Time: 01/06/19  5:34 PM   Specimen: Right Antecubital; Blood  Result Value Ref Range Status   Specimen Description   Final    RIGHT ANTECUBITAL Performed at [facility] [address], [place], [place] [address]    Special Requests   Final    BOTTLES DRAWN AEROBIC ONLY Blood Culture adequate volume Performed at [facility] [address] [place], [place] [address]    Culture   Final    NO GROWTH 5 DAYS Performed at [facility], [place] [address], [place], [place] [address]    Report Status 01/11/2019 FINAL  Final  Culture, blood (routine x 2)     Status: None   Collection Time: 01/06/19  5:44 PM   Specimen: Right Antecubital; Blood  Result Value Ref Range Status   Specimen Description   Final    RIGHT ANTECUBITAL Performed at [facility] [address], [place], [place] [address]    Special Requests   Final    BOTTLES DRAWN AEROBIC AND ANAEROBIC Blood Culture adequate volume Performed at [facility] [address], [place], [place] [address]    Culture   Final    NO GROWTH 5 DAYS Performed at [facility], [place] [address] [place],  [address]    Report Status 01/11/2019 FINAL  Final  Respiratory Panel by PCR     Status: None   Collection Time: 01/07/19 11:30 AM   Specimen: Nasopharyngeal Swab; Respiratory  Result Value Ref Range Status   Adenovirus NOT DETECTED NOT DETECTED Final   Coronavirus 229E NOT DETECTED NOT DETECTED Final    Comment: (NOTE) The Coronavirus on the Respiratory Panel, DOES NOT test for the novel  Coronavirus (2019 nCoV)    Coronavirus HKU1 NOT DETECTED NOT DETECTED Final   Coronavirus NL63 NOT DETECTED NOT DETECTED Final   Coronavirus OC43 NOT DETECTED NOT DETECTED Final   Metapneumovirus NOT DETECTED NOT DETECTED Final   Rhinovirus / Enterovirus NOT DETECTED  NOT DETECTED Final   Influenza A NOT DETECTED NOT DETECTED Final   Influenza B NOT DETECTED NOT DETECTED Final   Parainfluenza Virus 1 NOT DETECTED NOT DETECTED Final   Parainfluenza Virus 2 NOT DETECTED NOT DETECTED Final  Parainfluenza Virus 3 NOT DETECTED NOT DETECTED Final   Parainfluenza Virus 4 NOT DETECTED NOT DETECTED Final   Respiratory Syncytial Virus NOT DETECTED NOT DETECTED Final   Bordetella pertussis NOT DETECTED NOT DETECTED Final   Chlamydophila pneumoniae NOT DETECTED NOT DETECTED Final   Mycoplasma pneumoniae NOT DETECTED NOT DETECTED Final    Comment: Performed at [facility], [place] [address] [place], [place] [address]    Addendum done on 01/11/2019 at 1:26 PM: Currently see previously done discharge summary on 01/10/2019. No changes overnight. Discharge patient as planned.  Follow previous discharge orders.   SIGNED:   Bonnell Public, MD  [facility] 01/11/2019, 1:25 PM Pager   If 7PM-7AM, please contact night-coverage [email or web address] Password [email or web address]

## 2019-01-11 NOTE — TOC Transition Note (Signed)
Transition of Care [facility]) - CM/SW Discharge Note   Patient Details  Name: Lenard Kampf MRN: [id number] Date of Birth: March 25, 1929  Transition of [facility]) CM/SW Contact:  Wende Neighbors, LCSW Phone Number: 01/11/2019, 11:42 AM   Clinical Narrative:   Patient  Is from [place] and has approval from insurance to return back to [facility]. patient was unable to return yesterday due to needing to receive a PICC line. Patient has PICC line and is medically ready for discharge. CSW spoke with admission coordinator sheila yesterday and was given permission to return patient back. CSW was unable to get a hold of admission coordinator today after multiple attempts to call and text her. CSW spoke to [employer] from [employer] and she stated patient would be able to return once report has been given by Sharyn Lull. CSW faxed over discharge summary to facility. Family is aware of patients discharge back to facility.   RN to please contact [phone] and ask for RN. [facility] has been called and transport has been set for 1:30 pm pick up        Final next level of care: [place] Barriers to Discharge: No Barriers Identified   Patient Goals and CMS Choice Patient states their goals for this hospitalization and ongoing recovery are:: did not participate      Discharge Placement              Patient chooses bed at: [facility] Patient to be transferred to facility by: [facility] Name of family member notified: spoke with son via phone Patient and family notified of of transfer: 01/11/19  Discharge Plan and Services In-house Referral: Clinical Social Work                                   Social Determinants of Health ([place]) Interventions     Readmission Risk Interventions No flowsheet data found.

## 2019-09-07 DEATH — deceased

## 2021-01-10 IMAGING — CT CT EXTREM UP ENTIRE ARM*L* W/ CM
2 of 5 series · 13 of 36 positions shown, 16 images · IV contrast (OMNIPAQUE 300)
Comparison: None.

CLINICAL DATA: Left upper extremity pain and swelling.

EXAM:
CT OF THE UPPER LEFT EXTREMITY WITH CONTRAST
TECHNIQUE: Multidetector CT imaging of the upper left extremity was performed
according to the standard protocol following intravenous contrast
administration.
CONTRAST:  100mL OMNIPAQUE IOHEXOL 300 MG/ML  SOLN

[Series 3: axial st · axial · 0.54mm/px · z∈[-702,-72]mm · 12 of 373 slices shown, 15 images]
[im 29/373  soft-tissue]
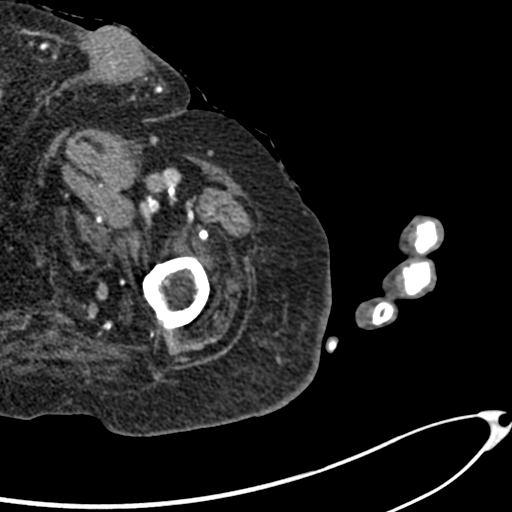
[im 29/373  bone]
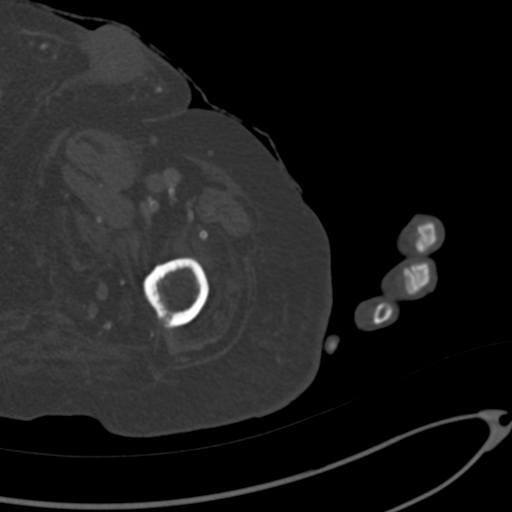
[im 58/373  bone]
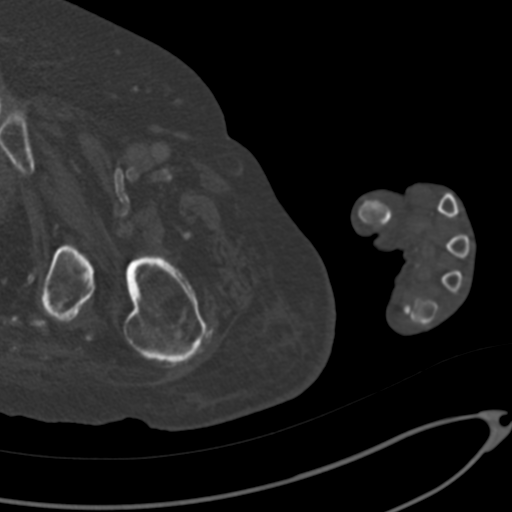
[im 86/373  bone]
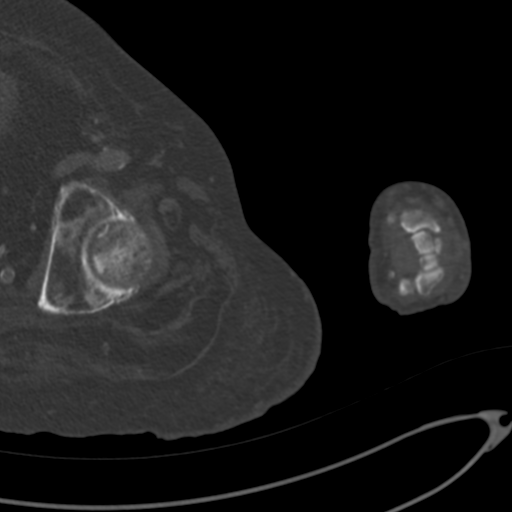
[im 115/373  bone]
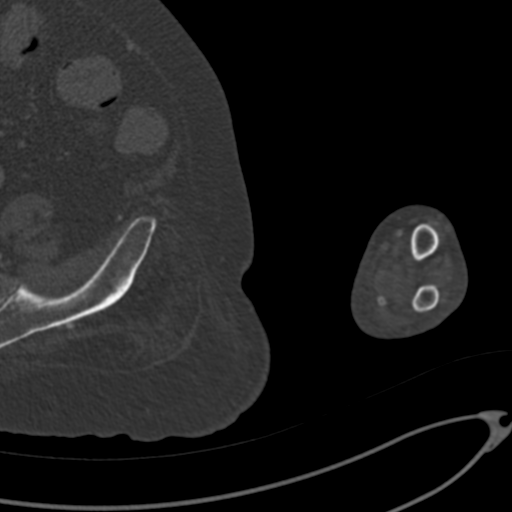
[im 144/373  soft-tissue]
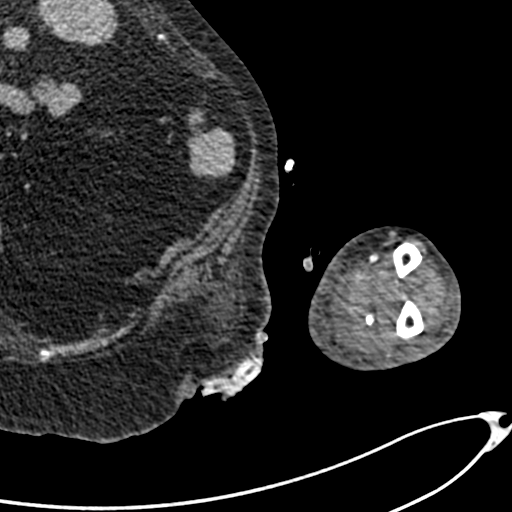
[im 144/373  bone]
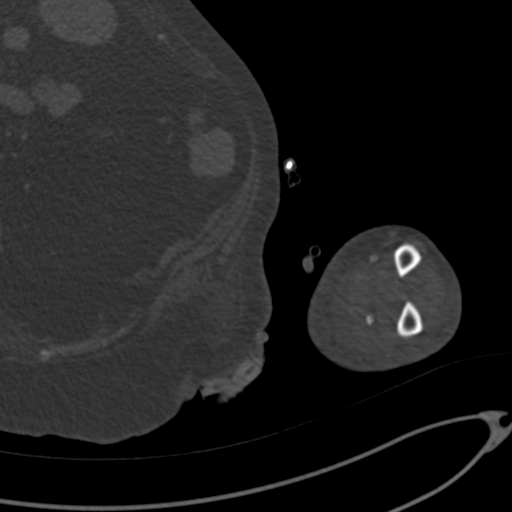
[im 172/373  bone]
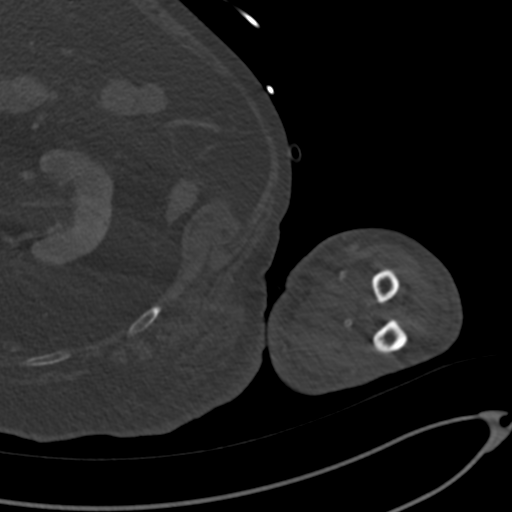
[im 201/373  bone]
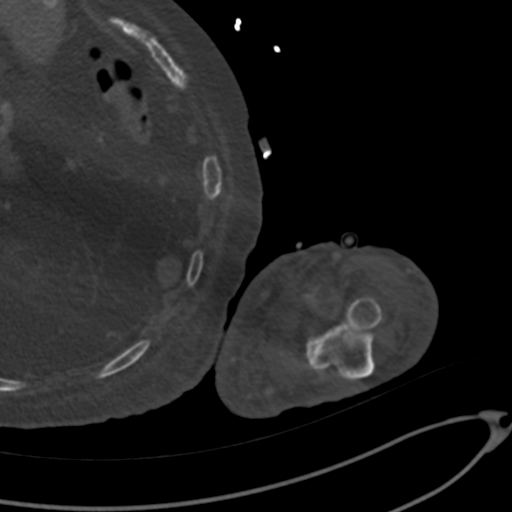
[im 229/373  bone]
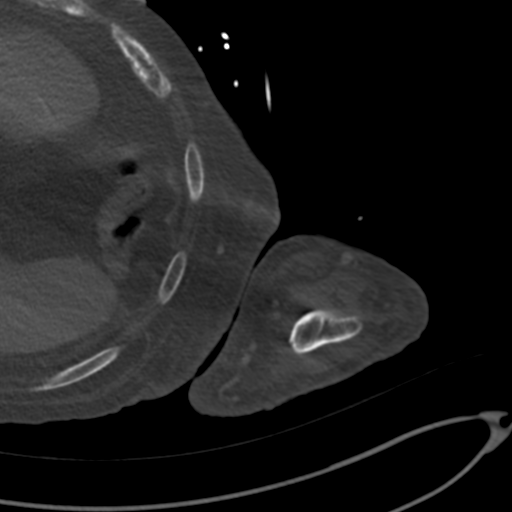
[im 258/373  soft-tissue]
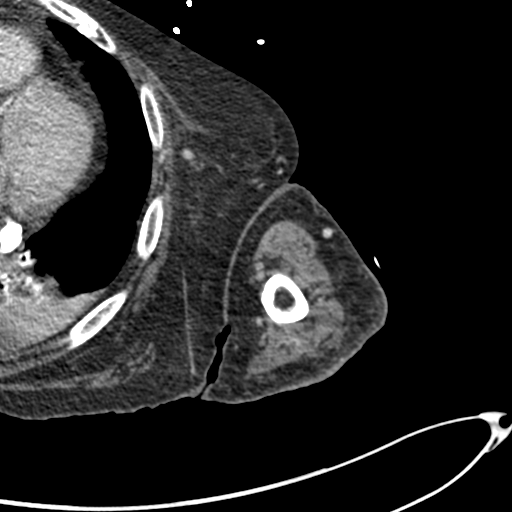
[im 258/373  bone]
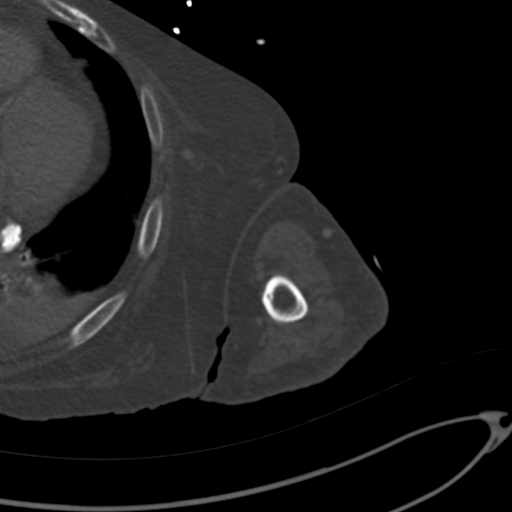
[im 287/373  bone]
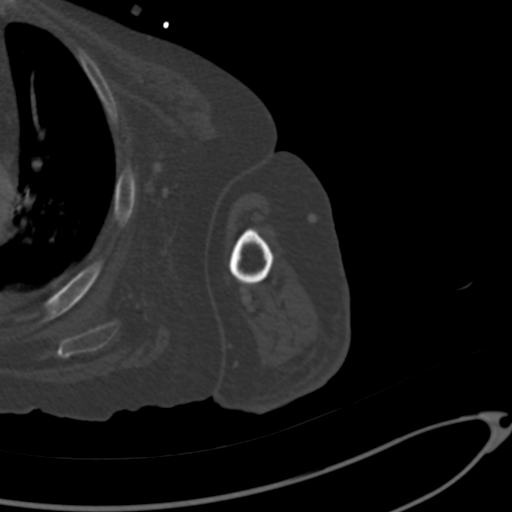
[im 315/373  bone]
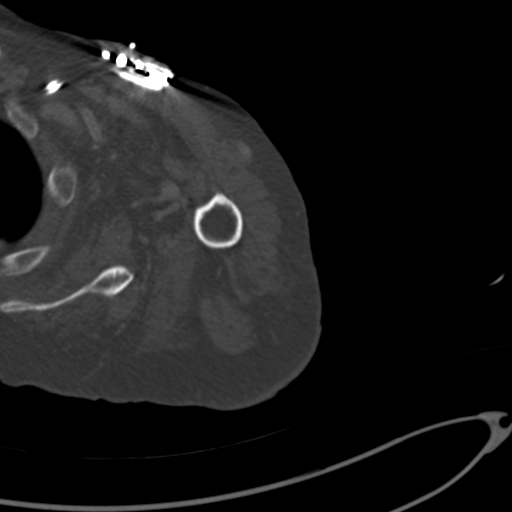
[im 344/373  bone]
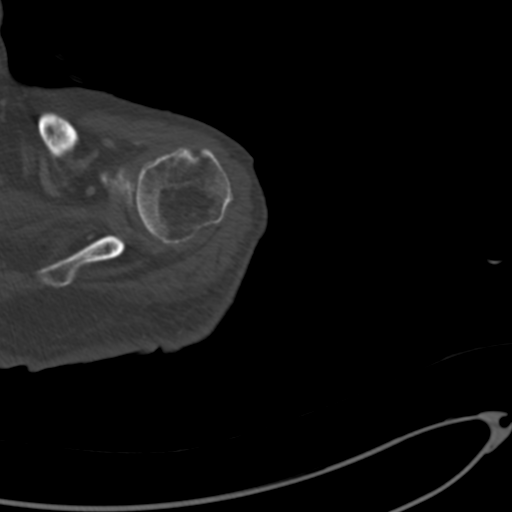

[Series 7: cor st · coronal · 0.50mm/px · 1 of 112 slices shown]
[im 56/112  bone]
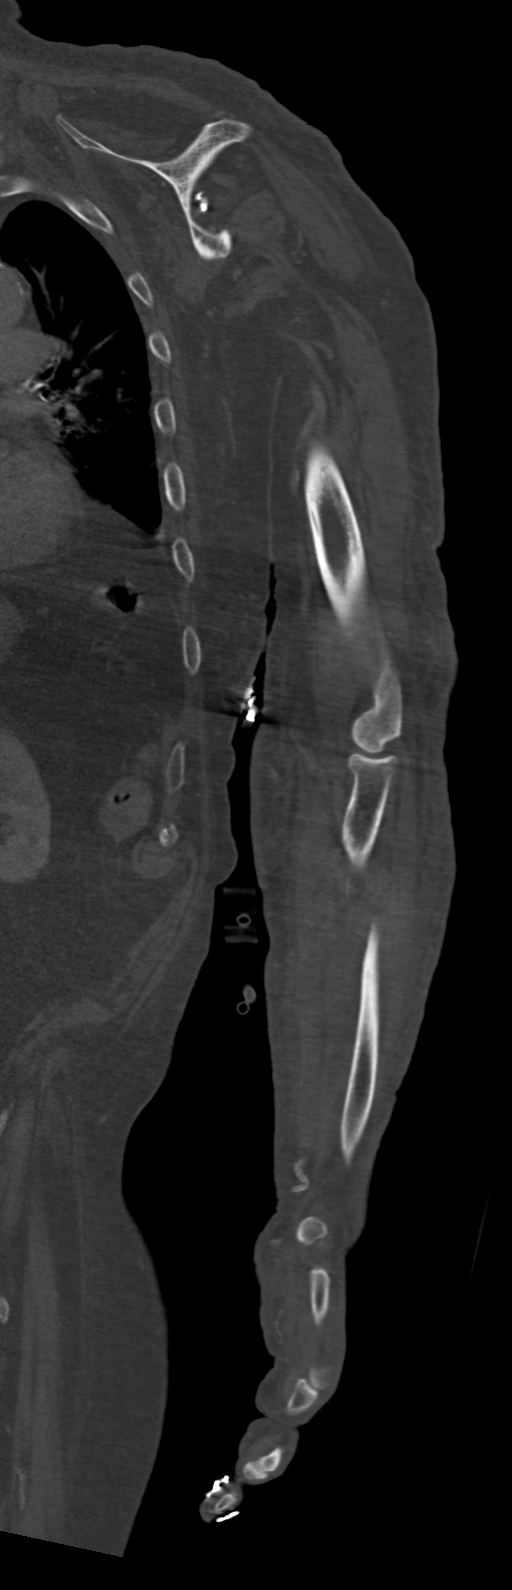

[13 of 36 positions shown; findings below may reference images not displayed]

FINDINGS: Diffuse subcutaneous soft tissue swelling/edema/fluid most notably
involving the forearm area. No discrete rim enhancing fluid
collection to suggest a drainable soft tissue abscess. Suspect
myofasciitis involving the forearm musculature without obvious
changes of pyomyositis.

Degenerative changes are noted at the shoulder, elbow, wrist and
hand and hip joints. No obvious destructive bony changes to suggest
osteomyelitis. No obvious CT findings for septic arthritis. Severe
rotator cuff disease.

Advanced vascular calcifications.

Incidental note is made of a small left pleural effusion and
overlying atelectasis. Calcified mediastinal lymph nodes are noted.
IMPRESSION: 1. Cellulitis and myofasciitis involving the left forearm in
particular but no definite CT findings for discrete drainable
subcutaneous abscess or pyomyositis.
2. Advanced degenerative changes involving all of the joints but no
obvious CT findings to suggest septic arthritis or osteomyelitis.
3. Severe/advanced vascular disease.
4. Small left pleural effusion and overlying atelectasis.
